# Patient Record
Sex: Female | Born: 1955 | Race: White | Hispanic: No | Smoking: Never smoker
Health system: Southern US, Community
[De-identification: ages and names within clinical notes are randomized; demographics above are authoritative.]

## PROBLEM LIST (undated history)

## (undated) DIAGNOSIS — F329 Major depressive disorder, single episode, unspecified: Secondary | ICD-10-CM

## (undated) DIAGNOSIS — C801 Malignant (primary) neoplasm, unspecified: Secondary | ICD-10-CM

## (undated) DIAGNOSIS — F32A Depression, unspecified: Secondary | ICD-10-CM

## (undated) DIAGNOSIS — B2790 Infectious mononucleosis, unspecified without complication: Secondary | ICD-10-CM

## (undated) DIAGNOSIS — E785 Hyperlipidemia, unspecified: Secondary | ICD-10-CM

## (undated) DIAGNOSIS — E079 Disorder of thyroid, unspecified: Secondary | ICD-10-CM

## (undated) DIAGNOSIS — I341 Nonrheumatic mitral (valve) prolapse: Secondary | ICD-10-CM

## (undated) HISTORY — DX: Hyperlipidemia, unspecified: E78.5

## (undated) HISTORY — PX: BREAST SURGERY: SHX581

## (undated) HISTORY — PX: MASTECTOMY: SHX3

## (undated) HISTORY — PX: ABDOMINAL HYSTERECTOMY: SHX81

## (undated) HISTORY — PX: OTHER SURGICAL HISTORY: SHX169

---

## 1988-04-13 DIAGNOSIS — I341 Nonrheumatic mitral (valve) prolapse: Secondary | ICD-10-CM

## 1988-04-13 HISTORY — DX: Nonrheumatic mitral (valve) prolapse: I34.1

## 1998-05-10 ENCOUNTER — Other Ambulatory Visit: Admission: RE | Admit: 1998-05-10 | Discharge: 1998-05-10 | Payer: Self-pay | Admitting: Gynecology

## 1999-07-17 ENCOUNTER — Encounter (INDEPENDENT_AMBULATORY_CARE_PROVIDER_SITE_OTHER): Payer: Self-pay

## 1999-07-17 ENCOUNTER — Other Ambulatory Visit: Admission: RE | Admit: 1999-07-17 | Discharge: 1999-07-17 | Payer: Self-pay | Admitting: Gynecology

## 2000-10-19 ENCOUNTER — Other Ambulatory Visit: Admission: RE | Admit: 2000-10-19 | Discharge: 2000-10-19 | Payer: Self-pay | Admitting: Gynecology

## 2000-10-19 ENCOUNTER — Encounter (INDEPENDENT_AMBULATORY_CARE_PROVIDER_SITE_OTHER): Payer: Self-pay | Admitting: Specialist

## 2001-11-23 ENCOUNTER — Other Ambulatory Visit: Admission: RE | Admit: 2001-11-23 | Discharge: 2001-11-23 | Payer: Self-pay | Admitting: Gynecology

## 2002-05-14 ENCOUNTER — Encounter: Payer: Self-pay | Admitting: Emergency Medicine

## 2002-05-14 ENCOUNTER — Emergency Department (HOSPITAL_COMMUNITY): Admission: EM | Admit: 2002-05-14 | Discharge: 2002-05-14 | Payer: Self-pay | Admitting: Emergency Medicine

## 2002-12-11 ENCOUNTER — Other Ambulatory Visit: Admission: RE | Admit: 2002-12-11 | Discharge: 2002-12-11 | Payer: Self-pay | Admitting: Gynecology

## 2003-05-18 ENCOUNTER — Encounter: Admission: RE | Admit: 2003-05-18 | Discharge: 2003-05-18 | Payer: Self-pay | Admitting: Psychiatry

## 2004-01-10 ENCOUNTER — Other Ambulatory Visit: Admission: RE | Admit: 2004-01-10 | Discharge: 2004-01-10 | Payer: Self-pay | Admitting: Gynecology

## 2004-03-28 ENCOUNTER — Ambulatory Visit (HOSPITAL_COMMUNITY): Admission: RE | Admit: 2004-03-28 | Discharge: 2004-03-28 | Payer: Self-pay | Admitting: Gynecology

## 2005-01-28 ENCOUNTER — Encounter (INDEPENDENT_AMBULATORY_CARE_PROVIDER_SITE_OTHER): Payer: Self-pay | Admitting: *Deleted

## 2005-01-28 ENCOUNTER — Observation Stay (HOSPITAL_COMMUNITY): Admission: RE | Admit: 2005-01-28 | Discharge: 2005-01-29 | Payer: Self-pay | Admitting: *Deleted

## 2006-02-02 ENCOUNTER — Encounter: Admission: RE | Admit: 2006-02-02 | Discharge: 2006-02-02 | Payer: Self-pay | Admitting: Orthopaedic Surgery

## 2006-02-23 ENCOUNTER — Other Ambulatory Visit: Admission: RE | Admit: 2006-02-23 | Discharge: 2006-02-23 | Payer: Self-pay | Admitting: Gynecology

## 2007-03-08 ENCOUNTER — Other Ambulatory Visit: Admission: RE | Admit: 2007-03-08 | Discharge: 2007-03-08 | Payer: Self-pay | Admitting: Gynecology

## 2008-05-10 ENCOUNTER — Other Ambulatory Visit: Admission: RE | Admit: 2008-05-10 | Discharge: 2008-05-10 | Payer: Self-pay | Admitting: Gynecology

## 2008-06-14 ENCOUNTER — Encounter: Admission: RE | Admit: 2008-06-14 | Discharge: 2008-06-14 | Payer: Self-pay | Admitting: General Surgery

## 2008-06-21 ENCOUNTER — Ambulatory Visit (HOSPITAL_COMMUNITY): Admission: RE | Admit: 2008-06-21 | Discharge: 2008-06-21 | Payer: Self-pay | Admitting: General Surgery

## 2008-06-21 ENCOUNTER — Encounter (INDEPENDENT_AMBULATORY_CARE_PROVIDER_SITE_OTHER): Payer: Self-pay | Admitting: General Surgery

## 2008-06-22 ENCOUNTER — Ambulatory Visit: Payer: Self-pay | Admitting: Oncology

## 2008-07-03 ENCOUNTER — Encounter (INDEPENDENT_AMBULATORY_CARE_PROVIDER_SITE_OTHER): Payer: Self-pay | Admitting: General Surgery

## 2008-07-03 ENCOUNTER — Ambulatory Visit (HOSPITAL_COMMUNITY): Admission: RE | Admit: 2008-07-03 | Discharge: 2008-07-03 | Payer: Self-pay | Admitting: General Surgery

## 2008-07-04 ENCOUNTER — Ambulatory Visit: Admission: RE | Admit: 2008-07-04 | Discharge: 2008-07-23 | Payer: Self-pay | Admitting: Radiation Oncology

## 2008-07-11 LAB — CBC WITH DIFFERENTIAL/PLATELET
BASO%: 0.7 % (ref 0.0–2.0)
EOS%: 2.4 % (ref 0.0–7.0)
MCH: 33.5 pg (ref 25.1–34.0)
MCHC: 35.2 g/dL (ref 31.5–36.0)
RDW: 12.7 % (ref 11.2–14.5)
lymph#: 1.8 10*3/uL (ref 0.9–3.3)

## 2008-07-11 LAB — COMPREHENSIVE METABOLIC PANEL
ALT: 21 U/L (ref 0–35)
AST: 24 U/L (ref 0–37)
Albumin: 4 g/dL (ref 3.5–5.2)
Calcium: 8.9 mg/dL (ref 8.4–10.5)
Chloride: 104 mEq/L (ref 96–112)
Potassium: 3.7 mEq/L (ref 3.5–5.3)

## 2008-07-25 ENCOUNTER — Ambulatory Visit: Payer: Self-pay | Admitting: Cardiology

## 2008-07-25 ENCOUNTER — Ambulatory Visit: Admission: RE | Admit: 2008-07-25 | Discharge: 2008-07-25 | Payer: Self-pay | Admitting: Oncology

## 2008-07-25 ENCOUNTER — Encounter: Payer: Self-pay | Admitting: Oncology

## 2008-08-02 ENCOUNTER — Ambulatory Visit: Payer: Self-pay | Admitting: Oncology

## 2008-08-02 LAB — CBC WITH DIFFERENTIAL/PLATELET
BASO%: 0.1 % (ref 0.0–2.0)
Eosinophils Absolute: 0 10*3/uL (ref 0.0–0.5)
MCHC: 34.8 g/dL (ref 31.5–36.0)
MONO#: 0.9 10*3/uL (ref 0.1–0.9)
NEUT#: 8.9 10*3/uL — ABNORMAL HIGH (ref 1.5–6.5)
Platelets: 266 10*3/uL (ref 145–400)
RBC: 4.08 10*6/uL (ref 3.70–5.45)
RDW: 12.6 % (ref 11.2–14.5)
WBC: 10.8 10*3/uL — ABNORMAL HIGH (ref 3.9–10.3)
lymph#: 1 10*3/uL (ref 0.9–3.3)

## 2008-08-09 LAB — COMPREHENSIVE METABOLIC PANEL
ALT: 74 U/L — ABNORMAL HIGH (ref 0–35)
CO2: 30 mEq/L (ref 19–32)
Calcium: 9.3 mg/dL (ref 8.4–10.5)
Chloride: 104 mEq/L (ref 96–112)
Creatinine, Ser: 0.68 mg/dL (ref 0.40–1.20)
Glucose, Bld: 104 mg/dL — ABNORMAL HIGH (ref 70–99)
Sodium: 140 mEq/L (ref 135–145)
Total Bilirubin: 0.9 mg/dL (ref 0.3–1.2)
Total Protein: 6.4 g/dL (ref 6.0–8.3)

## 2008-08-09 LAB — CBC WITH DIFFERENTIAL/PLATELET
BASO%: 0.4 % (ref 0.0–2.0)
Eosinophils Absolute: 0.1 10*3/uL (ref 0.0–0.5)
HCT: 39 % (ref 34.8–46.6)
HGB: 13.4 g/dL (ref 11.6–15.9)
LYMPH%: 12.6 % — ABNORMAL LOW (ref 14.0–49.7)
MCHC: 34.5 g/dL (ref 31.5–36.0)
MONO#: 2.3 10*3/uL — ABNORMAL HIGH (ref 0.1–0.9)
NEUT#: 15.5 10*3/uL — ABNORMAL HIGH (ref 1.5–6.5)
NEUT%: 75.3 % (ref 38.4–76.8)
Platelets: 278 10*3/uL (ref 145–400)
WBC: 20.6 10*3/uL — ABNORMAL HIGH (ref 3.9–10.3)
lymph#: 2.6 10*3/uL (ref 0.9–3.3)

## 2008-08-23 LAB — CBC WITH DIFFERENTIAL/PLATELET
BASO%: 0.1 % (ref 0.0–2.0)
Eosinophils Absolute: 0 10*3/uL (ref 0.0–0.5)
MONO#: 0.7 10*3/uL (ref 0.1–0.9)
MONO%: 7 % (ref 0.0–14.0)
NEUT#: 7.8 10*3/uL — ABNORMAL HIGH (ref 1.5–6.5)
RBC: 3.83 10*6/uL (ref 3.70–5.45)
RDW: 13.4 % (ref 11.2–14.5)
WBC: 9.4 10*3/uL (ref 3.9–10.3)

## 2008-08-30 LAB — COMPREHENSIVE METABOLIC PANEL
ALT: 23 U/L (ref 0–35)
Albumin: 3.9 g/dL (ref 3.5–5.2)
CO2: 31 mEq/L (ref 19–32)
Calcium: 9.1 mg/dL (ref 8.4–10.5)
Chloride: 100 mEq/L (ref 96–112)
Glucose, Bld: 100 mg/dL — ABNORMAL HIGH (ref 70–99)
Sodium: 137 mEq/L (ref 135–145)
Total Bilirubin: 1.1 mg/dL (ref 0.3–1.2)
Total Protein: 6.5 g/dL (ref 6.0–8.3)

## 2008-08-30 LAB — CBC WITH DIFFERENTIAL/PLATELET
Eosinophils Absolute: 0.1 10*3/uL (ref 0.0–0.5)
HCT: 35.7 % (ref 34.8–46.6)
LYMPH%: 18.8 % (ref 14.0–49.7)
MONO#: 1.5 10*3/uL — ABNORMAL HIGH (ref 0.1–0.9)
NEUT#: 7.1 10*3/uL — ABNORMAL HIGH (ref 1.5–6.5)
Platelets: 286 10*3/uL (ref 145–400)
RBC: 3.76 10*6/uL (ref 3.70–5.45)
WBC: 10.8 10*3/uL — ABNORMAL HIGH (ref 3.9–10.3)
lymph#: 2 10*3/uL (ref 0.9–3.3)

## 2008-09-11 ENCOUNTER — Ambulatory Visit: Payer: Self-pay | Admitting: Oncology

## 2008-09-13 LAB — CBC WITH DIFFERENTIAL/PLATELET
BASO%: 0.1 % (ref 0.0–2.0)
LYMPH%: 6.9 % — ABNORMAL LOW (ref 14.0–49.7)
MCHC: 33.9 g/dL (ref 31.5–36.0)
MCV: 94.6 fL (ref 79.5–101.0)
MONO%: 2.8 % (ref 0.0–14.0)
Platelets: 301 10*3/uL (ref 145–400)
RBC: 3.55 10*6/uL — ABNORMAL LOW (ref 3.70–5.45)
nRBC: 0 % (ref 0–0)

## 2008-09-20 LAB — CBC WITH DIFFERENTIAL/PLATELET
BASO%: 2.1 % — ABNORMAL HIGH (ref 0.0–2.0)
EOS%: 1.1 % (ref 0.0–7.0)
HCT: 34.8 % (ref 34.8–46.6)
MCH: 32.1 pg (ref 25.1–34.0)
MCHC: 33.9 g/dL (ref 31.5–36.0)
NEUT%: 59.7 % (ref 38.4–76.8)
RDW: 13.8 % (ref 11.2–14.5)
lymph#: 2.2 10*3/uL (ref 0.9–3.3)

## 2008-10-04 LAB — CBC WITH DIFFERENTIAL/PLATELET
BASO%: 0.1 % (ref 0.0–2.0)
Basophils Absolute: 0 10*3/uL (ref 0.0–0.1)
EOS%: 0 % (ref 0.0–7.0)
HGB: 11.3 g/dL — ABNORMAL LOW (ref 11.6–15.9)
MCH: 31.9 pg (ref 25.1–34.0)
RDW: 13.9 % (ref 11.2–14.5)
lymph#: 0.7 10*3/uL — ABNORMAL LOW (ref 0.9–3.3)
nRBC: 0 % (ref 0–0)

## 2008-10-16 ENCOUNTER — Ambulatory Visit: Payer: Self-pay | Admitting: Oncology

## 2008-10-18 LAB — COMPREHENSIVE METABOLIC PANEL
ALT: 35 U/L (ref 0–35)
Alkaline Phosphatase: 101 U/L (ref 39–117)
CO2: 29 mEq/L (ref 19–32)
Sodium: 140 mEq/L (ref 135–145)
Total Bilirubin: 1.3 mg/dL — ABNORMAL HIGH (ref 0.3–1.2)
Total Protein: 6.9 g/dL (ref 6.0–8.3)

## 2008-10-18 LAB — CBC WITH DIFFERENTIAL/PLATELET
BASO%: 0.4 % (ref 0.0–2.0)
LYMPH%: 15.7 % (ref 14.0–49.7)
MCHC: 34.7 g/dL (ref 31.5–36.0)
MONO#: 0.7 10*3/uL (ref 0.1–0.9)
MONO%: 8.9 % (ref 0.0–14.0)
Platelets: 272 10*3/uL (ref 145–400)
RBC: 3.75 10*6/uL (ref 3.70–5.45)
RDW: 15 % — ABNORMAL HIGH (ref 11.2–14.5)
WBC: 7.6 10*3/uL (ref 3.9–10.3)

## 2009-01-22 ENCOUNTER — Ambulatory Visit: Payer: Self-pay | Admitting: Oncology

## 2009-01-24 LAB — CBC WITH DIFFERENTIAL/PLATELET
Basophils Absolute: 0 10*3/uL (ref 0.0–0.1)
Eosinophils Absolute: 0.1 10*3/uL (ref 0.0–0.5)
HGB: 13.7 g/dL (ref 11.6–15.9)
MONO#: 0.4 10*3/uL (ref 0.1–0.9)
MONO%: 8.1 % (ref 0.0–14.0)
NEUT#: 2.7 10*3/uL (ref 1.5–6.5)
RBC: 4.22 10*6/uL (ref 3.70–5.45)
RDW: 12.7 % (ref 11.2–14.5)
WBC: 4.4 10*3/uL (ref 3.9–10.3)
lymph#: 1.2 10*3/uL (ref 0.9–3.3)

## 2009-01-24 LAB — COMPREHENSIVE METABOLIC PANEL
Albumin: 4.4 g/dL (ref 3.5–5.2)
Alkaline Phosphatase: 85 U/L (ref 39–117)
CO2: 27 mEq/L (ref 19–32)
Calcium: 9.6 mg/dL (ref 8.4–10.5)
Chloride: 108 mEq/L (ref 96–112)
Glucose, Bld: 125 mg/dL — ABNORMAL HIGH (ref 70–99)
Potassium: 4 mEq/L (ref 3.5–5.3)
Sodium: 143 mEq/L (ref 135–145)
Total Protein: 6.7 g/dL (ref 6.0–8.3)

## 2009-12-19 ENCOUNTER — Ambulatory Visit (HOSPITAL_BASED_OUTPATIENT_CLINIC_OR_DEPARTMENT_OTHER): Payer: BC Managed Care – PPO | Admitting: Oncology

## 2009-12-24 LAB — COMPREHENSIVE METABOLIC PANEL
ALT: 21 U/L (ref 0–35)
AST: 23 U/L (ref 0–37)
BUN: 21 mg/dL (ref 6–23)
CO2: 26 mEq/L (ref 19–32)
Creatinine, Ser: 0.78 mg/dL (ref 0.40–1.20)
Total Bilirubin: 1.1 mg/dL (ref 0.3–1.2)

## 2009-12-24 LAB — CBC WITH DIFFERENTIAL/PLATELET
BASO%: 0.5 % (ref 0.0–2.0)
Basophils Absolute: 0 10*3/uL (ref 0.0–0.1)
EOS%: 0.7 % (ref 0.0–7.0)
HCT: 37.5 % (ref 34.8–46.6)
LYMPH%: 34.4 % (ref 14.0–49.7)
MCH: 33.3 pg (ref 25.1–34.0)
MCHC: 34.9 g/dL (ref 31.5–36.0)
NEUT%: 52.8 % (ref 38.4–76.8)
Platelets: 313 10*3/uL (ref 145–400)

## 2009-12-24 LAB — CANCER ANTIGEN 27.29: CA 27.29: 31 U/mL (ref 0–39)

## 2010-01-15 ENCOUNTER — Encounter: Admission: RE | Admit: 2010-01-15 | Discharge: 2010-01-15 | Payer: Self-pay | Admitting: Family Medicine

## 2010-05-04 ENCOUNTER — Encounter: Payer: Self-pay | Admitting: Family Medicine

## 2010-06-23 ENCOUNTER — Encounter: Payer: BC Managed Care – PPO | Admitting: Oncology

## 2010-06-23 DIAGNOSIS — C50219 Malignant neoplasm of upper-inner quadrant of unspecified female breast: Secondary | ICD-10-CM

## 2010-06-23 LAB — CBC WITH DIFFERENTIAL/PLATELET
Basophils Absolute: 0 10*3/uL (ref 0.0–0.1)
EOS%: 2.6 % (ref 0.0–7.0)
Eosinophils Absolute: 0.1 10*3/uL (ref 0.0–0.5)
HCT: 38 % (ref 34.8–46.6)
HGB: 13.2 g/dL (ref 11.6–15.9)
MCH: 33.1 pg (ref 25.1–34.0)
MONO#: 0.4 10*3/uL (ref 0.1–0.9)
NEUT#: 2.1 10*3/uL (ref 1.5–6.5)
NEUT%: 50.7 % (ref 38.4–76.8)
lymph#: 1.5 10*3/uL (ref 0.9–3.3)

## 2010-06-23 LAB — COMPREHENSIVE METABOLIC PANEL
Albumin: 4 g/dL (ref 3.5–5.2)
BUN: 15 mg/dL (ref 6–23)
CO2: 26 mEq/L (ref 19–32)
Calcium: 9.2 mg/dL (ref 8.4–10.5)
Chloride: 103 mEq/L (ref 96–112)
Creatinine, Ser: 0.61 mg/dL (ref 0.40–1.20)
Glucose, Bld: 123 mg/dL — ABNORMAL HIGH (ref 70–99)
Potassium: 4 mEq/L (ref 3.5–5.3)

## 2010-06-23 LAB — CANCER ANTIGEN 27.29: CA 27.29: 32 U/mL (ref 0–39)

## 2010-06-26 ENCOUNTER — Encounter (HOSPITAL_BASED_OUTPATIENT_CLINIC_OR_DEPARTMENT_OTHER): Payer: BC Managed Care – PPO | Admitting: Oncology

## 2010-06-26 DIAGNOSIS — C50219 Malignant neoplasm of upper-inner quadrant of unspecified female breast: Secondary | ICD-10-CM

## 2010-07-24 LAB — BASIC METABOLIC PANEL
CO2: 30 mEq/L (ref 19–32)
Calcium: 9.7 mg/dL (ref 8.4–10.5)
Creatinine, Ser: 0.59 mg/dL (ref 0.4–1.2)
GFR calc Af Amer: >60 mL/min (ref >60)
GFR calc non Af Amer: >60 mL/min (ref >60)
Glucose, Bld: 92 mg/dL (ref 70–99)
Sodium: 140 mEq/L (ref 135–145)

## 2010-07-24 LAB — DIFFERENTIAL
Basophils Relative: 2 % — ABNORMAL HIGH (ref 0–1)
Eosinophils Absolute: 0.2 10*3/uL (ref 0.0–0.7)
Eosinophils Relative: 4 % (ref 0–5)
Lymphs Abs: 1.8 10*3/uL (ref 0.7–4.0)
Monocytes Relative: 12 % (ref 3–12)
Neutrophils Relative %: 46 % (ref 43–77)

## 2010-07-24 LAB — COMPREHENSIVE METABOLIC PANEL
ALT: 16 U/L (ref 0–35)
AST: 21 U/L (ref 0–37)
Alkaline Phosphatase: 66 U/L (ref 39–117)
CO2: 30 mEq/L (ref 19–32)
Calcium: 9.2 mg/dL (ref 8.4–10.5)
GFR calc Af Amer: >60 mL/min (ref >60)
GFR calc non Af Amer: >60 mL/min (ref >60)
Glucose, Bld: 106 mg/dL — ABNORMAL HIGH (ref 70–99)
Potassium: 5.3 mEq/L — ABNORMAL HIGH (ref 3.5–5.1)
Sodium: 143 mEq/L (ref 135–145)

## 2010-07-24 LAB — CBC
Hemoglobin: 12.2 g/dL (ref 12.0–15.0)
MCHC: 35.1 g/dL (ref 30.0–36.0)
Platelets: 322 10*3/uL (ref 150–400)
RBC: 3.7 MIL/uL — ABNORMAL LOW (ref 3.87–5.11)
RDW: 12.9 % (ref 11.5–15.5)
WBC: 5 10*3/uL (ref 4.0–10.5)

## 2010-08-26 NOTE — Op Note (Signed)
Brandi Cummings, Brandi Cummings             ACCOUNT NO.:  [id number]   MEDICAL RECORD NO.:  [id number]          PATIENT TYPE:  AMB   LOCATION:  SDS                          FACILITY:  [facility]   PHYSICIAN:  Sharlet Salina T. Hoxworth, M.D.DATE OF BIRTH:  1956-01-01   DATE OF PROCEDURE:  06/21/2008  DATE OF DISCHARGE:                               OPERATIVE REPORT   PREOPERATIVE DIAGNOSIS:  Cancer, left breast.   POSTOPERATIVE DIAGNOSIS:  Cancer, left breast.   SURGICAL PROCEDURES:  1. Blue dye injection, left breast.  2. Left axillary sentinel lymph node biopsy.  3. Left breast lumpectomy (re-excision).   HISTORY:  Brandi Cummings is a 55 year old female who recently underwent  excision of a palpable abnormality in her upper left breast that was  felt to be radiographically benign.  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  This was not oriented due to preoperative  benign characteristics.  After discussion of options, we elected to  proceed with left axillary sentinel lymph node biopsy and re-excision of  the lumpectomy site.  She was brought to operating room for this  procedure.  Risks of bleeding, infection, and possible need for further  surgery based on final pathology findings have been discussed and  understood.   DESCRIPTION OF OPERATION:  The patient was brought to operating room,  placed in the supine position on the operating table, and general  laryngeal mask anesthesia was induced.  She was carefully positioned  with the left arm extended on an arm board.  Under sterile technique, 10  mL of dilute methylene blue was injected in the subcutaneous tissue  beneath the nipple and massaged.  She had undergone injection of 1 mCi  of technetium sulfa colloid intradermally around the nipple about 45  minutes prior to the procedure.  The entire left chest, arm were widely  sterilely prepped and draped.   She received preoperative IV antibiotics.  Correct patient and procedure were verified.  The NeoProbe was used to  localize a definite hot area in the left axilla.  A small transverse  incision was made and dissection was carried down through the  subcutaneous tissue using cautery.  Using careful blunt dissection the  axilla proper was entered and blue lymphatic was found.  Using the  NeoProbe and tracing the blue lymphatic, I dissected down to a bright  blue otherwise normal-appearing lymph node with very high counts.  This  was completely excised with cautery.  Ex vivo the node had counts of  3200 with background in the axilla of less than 20.  This was sent as a  hot blue sentinel lymph node for touch prep.  I then proceeded with the  re-excision.  The previous curvilinear scar in the upper outer quadrant  was excised and the previous lumpectomy was close to the skin, so skin  and subcu was taken with the specimen as the anterior margin.  The  seroma was evacuated.  There was very distinct lumpectomy cavity.  I  then sequentially excised the entire wall  of the lumpectomy cavity back  for about 0.5-1 cm in all directions, completely excising the cavity.  This was done for superior-inferior, medial-lateral, and deep margins  which were separately marked and oriented.  The soft tissue in both  incisions was infiltrated with Marcaine.  The touch prep on the sentinel  lymph node returned as negative.  In order to close the defect in the  breast tissue, breast tissue was mobilized centrally, inferiorly, and  laterally mobilizing some skin and subcu over the breast tissue and  mobilizing the breast tissue off the chest wall.  This was able to be  rotated and then to fill the defect and was closed with interrupted 3-0  Vicryl.  The subcu was closed with interrupted 3-0 Vicryl and skin  closed with subcuticular  Monocryl.  The sentinel node site was closed with subcutaneous 3-0  Vicryl and  subcuticular Monocryl.  Both incisions were dressed with  Dermabond.  Sponge, needle, and instrument counts were correct.  The  patient was taken to recovery in good condition.      Lorne Skeens. Hoxworth, M.D.  Electronically Signed     BTH/MEDQ  D:  06/21/2008  T:  06/22/2008  Job:  [id number]

## 2010-08-26 NOTE — Op Note (Signed)
NAMECOURTNEE, MYER             ACCOUNT NO.:  [id number]   MEDICAL RECORD NO.:  [id number]          PATIENT TYPE:  AMB   LOCATION:  DAY                          FACILITY:  [facility]   PHYSICIAN:  Sharlet Salina T. Hoxworth, M.D.DATE OF BIRTH:  01-14-1956   DATE OF PROCEDURE:  07/03/2008  DATE OF DISCHARGE:                               OPERATIVE REPORT   POSTOPERATIVE DIAGNOSIS:  Cancer left breast.   SURGICAL PROCEDURES:  Re-excision cancer left breast.   ANESTHESIA:  Laryngeal mask general.   BRIEF HISTORY:  This is a 55 year old female who has previously  undergone lumpectomy approximately 10 days ago for invasive in situ  carcinoma of the left breast.  The lumpectomy specimen had a focally  positive margin for DCIS at the deep margin.  I have recommended a re-  excision of this margin.  Risks of bleeding, infection were discussed  and understood.  She is brought to the operating room for this  procedure.   DESCRIPTION OF OPERATION:  The patient brought to operating room, placed  in the supine position on the operating table and laryngeal mask general  anesthesia was induced.  Left breast was widely sterilely prepped and  draped.  She received preoperative IV antibiotics.  Correct patient and  procedure were verified.  The previous curvilinear upper breast incision  was excised back to healthy skin and dissection carried down through  subcutaneous tissue into the lumpectomy cavity.  A small seroma and  clots were evacuated.  The breast tissue had been closed and the Vicryl  sutures were removed, completely opening the previous lumpectomy cavity  up.  Following this the entire deep margin which consisted of a small  amount of breast tissue and the pectoralis fascia was completely excised  down to muscle.  This was the complete margin and I included a little  bit of the sidewalls as well.  Hemostasis obtained with cautery.  The  soft tissues were all infiltrated with Marcaine.   Hemostasis was  assured.  The breast tissue which had been mobilized to close previously  was reclosed with interrupted Vicryl's, subcu closed with interrupted 3-  0 Vicryl, skin with subcuticular Monocryl and Dermabond.  Sponge and  needle counts correct.      Lorne Skeens. Hoxworth, M.D.  Electronically Signed     BTH/MEDQ  D:  07/03/2008  T:  07/03/2008  Job:  [id number]

## 2010-08-29 NOTE — Discharge Summary (Signed)
Brandi Cummings, Brandi Cummings             ACCOUNT NO.:  [id number]   MEDICAL RECORD NO.:  [id number]          PATIENT TYPE:  OBV   LOCATION:  9304                          [facility]ACILITY:  [facility]   PHYSICIAN:  Almedia Balls. Fore, M.D.   DATE OF BIRTH:  [birth year]   DATE OF ADMISSION:  01/28/2005  DATE OF DISCHARGE:  01/29/2005                                 DISCHARGE SUMMARY   HISTORY:  The patient is a 55 year old with abnormal uterine bleeding,  pelvic pain, uterine enlargement for hysterectomy and bilateral salpingo-  oophorectomy on January 28, 2005.  The remainder of her history and physical  are as previously dictated.  Laboratory data included preoperative  hemoglobin 12.9.   HOSPITAL COURSE:  The patient was taken to the operating room on January 28, 2005 at which time abdominal supracervical hysterectomy and bilateral  salpingo-oophorectomy were performed.  The patient did well postoperatively  except for some nausea and vomiting which resolved on the first  postoperative day.  Diet and ambulation progressed over the evening of  October 18 and early morning of October 19.  Later in the day on October 19,  she was afebrile, experiencing no problems and was well maintained on oral  medications.  It was felt that she could be discharged at this time.   FINAL DIAGNOSES:  1.  Abnormal uterine bleeding.  2.  Pelvic pain.  3.  Uterine enlargement.   OPERATION/PROCEDURE:  1.  Abdominal supracervical hysterectomy.  2.  Bilateral salpingo-oophorectomy.   Pathology report unavailable at the time of this discharge.   DISPOSITION:  Discharge home to return to the office in approximately two  weeks.  She was instructed to call if she has unusual bleeding, pain or  unexplained fever.  She was also instructed to gradually progress her  activities over several weeks at home and to limit lifting and driving for  two weeks.  She was fully ambulatory, on a regular diet and in good  condition at the time  of discharge.   DISCHARGE MEDICATIONS:  She was given prescription for:  1.  Mepergan Fortis generic #30 to be taken one q.4-6h. p.r.n. pain.  2.  Doxycycline 100 mg #12 to be taken one twice a day.  3.  Vivelle-Dot patches 0.05 mg change twice a week.           ______________________________  Almedia Balls. Randell Patient, M.D.     SRF/MEDQ  D:  01/29/2005  T:  01/30/2005  Job:  [id number]   cc:   Leatha Gilding. Mezer, M.D.  Fax: [phone]

## 2010-08-29 NOTE — H&P (Signed)
NAMEAMORA, SHEEHY             ACCOUNT NO.:  [id number]   MEDICAL RECORD NO.:  [id number]          PATIENT TYPE:  INP   LOCATION:  NA                            FACILITY[facility]   PHYSICIAN:  Almedia Balls. Fore, M.D.   DATE OF BIRTH:  01-Nov-1955   DATE OF ADMISSION:  DATE OF DISCHARGE:                                HISTORY & PHYSICAL   DATE OF ADMISSION:  January 28, 2005   HISTORY:  The patient is a 55 year old with persistent abnormal uterine  bleeding, uterine enlargement, for hysterectomy, bilateral salpingo-  oophorectomy on January 28, 2005. She has been evaluated over many years by  Dr. Chevis Pretty for this problem and has gotten to the point where her periods are  extremely heavy, very painful. She underwent endometrial biopsy in July 2006  with findings of benign endometrium. Pap smear done approximately 1 year was  within normal limits. She is admitted at this time for hysterectomy and has  been counseled as to the nature of the procedure and the risks involving, to  include risks of anesthesia; injury to bowel, bladder, blood vessels,  ureters; postoperative hemorrhage; infection; recuperation; hormone  replacement following surgery. She fully understands all these  considerations and wishes to proceed on January 28, 2005.   PAST MEDICAL HISTORY:  Includes laparoscopy in 1986, laparoscopy D&C in  1996. She is taking Zyrtec for allergies. She is sensitive to PENICILLIN.   FAMILY HISTORY:  Includes father with prostate cancer. Grandmother with  cervical cancer. Mother with hypertension. Father also had myocardial  infarction and stroke. Mother and uncles have diabetes mellitus.   REVIEW OF SYSTEMS:  HEENT:  Some hearing loss in her left ear.  CARDIORESPIRATORY:  Mitral valve prolapse. GASTROINTESTINAL:  History of  some hepatitis when she had mononucleosis as a teenager. GENITOURINARY:  As  in present illness. NEUROMUSCULAR:  Negative.   PHYSICAL EXAMINATION:  VITAL SIGNS:   Height 5 feet 9.5 inches tall, weight  166 pounds, blood pressure 116/68, pulse 84, respirations 18.  GENERAL:  Well-developed white female in no acute distress.  HEENT:  Within normal limits.  NECK:  Supple without masses, adenopathy, or bruits.  HEART:  Regular rate and rhythm without murmurs. Possible presystolic click  along the left sternal border.  BREASTS:  Without mass. Axilla negative.  ABDOMEN:  Flat and soft without masses.  PELVIC:  External genitalia, Bartholin's, urethra, and Skene's glands within  normal limits. Cervix is slightly inflamed. The uterus approximately 8-[redacted]  weeks gestational size, slightly irregular, tender. Adnexal areas without  palpable masses but somewhat tender bilaterally. Anterior and posterior cul-  de-sac exam is confirmatory.  EXTREMITIES:  Within normal limits.  CENTRAL NERVOUS SYSTEM:  Grossly intact.  SKIN:  Without suspicious lesions.   IMPRESSION:  Abnormal uterine bleeding, probable fibroids.   DISPOSITION:  As noted above.           ______________________________  Almedia Balls. Randell Patient, M.D.     SRF/MEDQ  D:  01/21/2005  T:  01/21/2005  Job:  [id number]

## 2010-09-16 ENCOUNTER — Other Ambulatory Visit: Payer: Self-pay | Admitting: Dermatology

## 2011-03-20 ENCOUNTER — Telehealth: Payer: Self-pay | Admitting: *Deleted

## 2011-03-20 NOTE — Telephone Encounter (Signed)
patient confirmed over the phone the new date and time on 06-25-2011 at 11:30am dr. Darnelle Catalan 06-18-2011 at 10:00am for lab only

## 2011-06-18 ENCOUNTER — Other Ambulatory Visit: Payer: BC Managed Care – PPO | Admitting: Lab

## 2011-06-25 ENCOUNTER — Ambulatory Visit: Payer: BC Managed Care – PPO | Admitting: Oncology

## 2011-08-18 ENCOUNTER — Other Ambulatory Visit (HOSPITAL_BASED_OUTPATIENT_CLINIC_OR_DEPARTMENT_OTHER): Payer: BC Managed Care – PPO | Admitting: Lab

## 2011-08-18 DIAGNOSIS — C50219 Malignant neoplasm of upper-inner quadrant of unspecified female breast: Secondary | ICD-10-CM

## 2011-08-18 LAB — CBC WITH DIFFERENTIAL/PLATELET
BASO%: 2.1 % — ABNORMAL HIGH (ref 0.0–2.0)
Basophils Absolute: 0.1 10*3/uL (ref 0.0–0.1)
HCT: 38.3 % (ref 34.8–46.6)
LYMPH%: 37.6 % (ref 14.0–49.7)
MCHC: 34.4 g/dL (ref 31.5–36.0)
MONO#: 0.5 10*3/uL (ref 0.1–0.9)
NEUT%: 45.6 % (ref 38.4–76.8)
Platelets: 314 10*3/uL (ref 145–400)
WBC: 4.5 10*3/uL (ref 3.9–10.3)

## 2011-08-18 LAB — COMPREHENSIVE METABOLIC PANEL
Alkaline Phosphatase: 89 U/L (ref 39–117)
CO2: 25 mEq/L (ref 19–32)
Creatinine, Ser: 0.72 mg/dL (ref 0.50–1.10)
Glucose, Bld: 115 mg/dL — ABNORMAL HIGH (ref 70–99)
Sodium: 142 mEq/L (ref 135–145)
Total Bilirubin: 1 mg/dL (ref 0.3–1.2)
Total Protein: 6.4 g/dL (ref 6.0–8.3)

## 2011-08-25 ENCOUNTER — Telehealth: Payer: Self-pay | Admitting: *Deleted

## 2011-08-25 ENCOUNTER — Ambulatory Visit (HOSPITAL_BASED_OUTPATIENT_CLINIC_OR_DEPARTMENT_OTHER): Payer: BC Managed Care – PPO | Admitting: Oncology

## 2011-08-25 VITALS — BP 132/86 | HR 99 | Temp 98.5°F | Ht 67.0 in | Wt 197.4 lb

## 2011-08-25 DIAGNOSIS — C50219 Malignant neoplasm of upper-inner quadrant of unspecified female breast: Secondary | ICD-10-CM

## 2011-08-25 DIAGNOSIS — C50919 Malignant neoplasm of unspecified site of unspecified female breast: Secondary | ICD-10-CM | POA: Insufficient documentation

## 2011-08-25 NOTE — Telephone Encounter (Signed)
gave patient appointment for 08-2012 with the lab being one week before the md appointment printed out calendar and gave to the patient

## 2011-08-25 NOTE — Progress Notes (Signed)
ID: Brandi Cummings   DOB: Aug 27, 1955  MR#: [id number]  CSN#:[id number]  HISTORY OF PRESENT ILLNESS: She noted a lump in her left breast in the fall of 2009 and had mammography at [facility] with Cassandria Anger on February 17, 2008.  He found the breast to be heterogeneously dense with no interval change by mammography.  Ultrasound showed an oval, well circumscribed hypoechoic mass measuring 1 cm in the 10'clock position, 2 cm from the nipple in the left breast corresponding to the palpable mass.  This was felt to be benign appearing fibrocystic change and a six-month mammogram was suggested.  In January of 2010 she saw Dr. Chevis Pretty who palpated the mass and he referred her to Dr. Jaclynn Guarneri for further evaluation.  Dr. Johna Sheriff reviewed the data and offered the patient further observation as already discussed versus excision and the patient chose the latter.  Her excisional biopsy was performed May 28, 2008 and showed an invasive ductal carcinoma, grade 3, measuring 7 mm, with good margins for the invasive component, but what looked like positive margins in the in situ component.  No lymphovascular invasion and a prognostic panel which showed the tumor to be ER and PR negative with a proliferation marker of 34% and HER2/neu negative by CISH with a ratio of 1.21 (PM10-121 and OS10-2305).  With this information, the patient proceeded to bilateral breast MRIs on March 4 with this showed only a seroma in the upper inner quadrant of the left breast measuring 4.9 cm, but otherwise no significant findings on either side and no evidence of internal mammary or axillary lymph node enhancement.  On June 21, 2008 the patient underwent reexcision together with left axillary lymph node biopsy ([id number]) a negative sentinel lymph node with the inferior margin still showing a microscopic focus of high-grade ductal carcinoma with the deep margins still showing focal involvement by ductal carcinoma in situ.  All the other margins  were negative.   Her subsequent history is as detailed below.  INTERVAL HISTORY: Brandi Cummings returns today for routine followup of her breast cancer. Since her last visit here she tells me Dr. Talmage Nap has found her to be diabetic. She has not started a diet or exercise program yet and we discussed extensively today.  REVIEW OF SYSTEMS: She came off her antidepressant about a month ago. She felt that they were contributing to weight gain. Initially she "ache all over". She still feels physically uncomfortable off those medications. She is not however depressed or anxious. She has occasional mild headaches. She is not aware of polyuria or dysuria. She runs a little bit on the constipated side and we talked about the use of MiraLAX. She still having hot flashes. She does not think these are of consequent she'll. Otherwise a detailed review of systems today was stable  PAST MEDICAL HISTORY: Significant for hysterectomy with bilateral salpingo-oophorectomy in 2006 "I kept my cervix."  She has a history of mitral valve prolapse not requiring antibiotics before dental procedures.  She has a history of hypothyroidism, history of migraines, history of mild osteoarthritis particularly involving the hands.  She has a history of in vitro fertilization remotely. New diagnosis of diabetes  FAMILY HISTORY The patient's father died at the age of 56 in the setting of prostate cancer.  The patient's mother is alive in her 56s.  The patient is one of four siblings.  There is no history of breast or ovarian cancer in the family.  GYNECOLOGIC HISTORY: She is Gx, P2 first  pregnancy at age 56.  She was still menstruating in 2006.  She did not take hormones after the hysterectomy and bilateral salpingo-oophorectomy.    SOCIAL HISTORY: She is a Futures trader.  Her husband Freida Busman works in Consulting civil engineer mostly in [place] and does a lot of traveling.  Both her sons are currently in college.  The patient attends a [employer].   ADVANCED DIRECTIVES: in place  HEALTH MAINTENANCE: History  Substance Use Topics  . Smoking status: Not on file  . Smokeless tobacco: Not on file  . Alcohol Use: Not on file     Allergies  Allergen Reactions  . Penicillins     Current Outpatient Prescriptions  Medication Sig Dispense Refill  . ARMOUR THYROID 15 MG tablet Take 45 mg by mouth daily.       . metFORMIN (GLUCOPHAGE-XR) 500 MG 24 hr tablet       . LORazepam (ATIVAN) 0.5 MG tablet         OBJECTIVE: 56-year-old white woman who appears well Filed Vitals:   08/25/11 0954  BP: 132/86  Pulse: 99  Temp: 98.5 F (36.9 C)     Body mass index is 30.92 kg/(m^2).    ECOG FS: 1  Sclerae unicteric Oropharynx clear No peripheral adenopathy Lungs no rales or rhonchi Heart regular rate and rhythm Abd benign MSK no focal spinal tenderness, no peripheral edema Neuro: nonfocal Breasts: She is status post bilateral mastectomies with bilateral reconstruction. There is no evidence of local recurrence  LAB RESULTS: Lab Results  Component Value Date   WBC 4.5 08/18/2011   NEUTROABS 2.0 08/18/2011   HGB 13.2 08/18/2011   HCT 38.3 08/18/2011   MCV 95.1 08/18/2011   PLT 314 08/18/2011      Chemistry      Component Value Date/Time   NA 142 08/18/2011 0909   K 3.9 08/18/2011 0909   CL 106 08/18/2011 0909   CO2 25 08/18/2011 0909   BUN 14 08/18/2011 0909   CREATININE 0.72 08/18/2011 0909      Component Value Date/Time   CALCIUM 9.6 08/18/2011 0909   ALKPHOS 89 08/18/2011 0909   AST 24 08/18/2011 0909   ALT 19 08/18/2011 0909   BILITOT 1.0 08/18/2011 0909       Lab Results  Component Value Date   LABCA2 32 08/18/2011    No components found with this basename: LABCA125    No results found for this basename: INR:1;PROTIME:1 in the last 168 hours  Urinalysis No results found for this basename: colorurine, appearanceur, labspec, phurine, glucoseu, hgbur, bilirubinur, ketonesur, proteinur, urobilinogen, nitrite, leukocytesur     STUDIES: No new results found.  ASSESSMENT: 56-year-old [place] woman status post left breast biopsy March 2010 for a triple negative invasive ductal carcinoma, T1bN0 (stage IA) , grade 3, treated adjuvantly with Cytoxan and Taxotere x4, completed in June 2010, followed by bilateral mastectomies and bilateral gluteal artery perforator flap reconstruction.   PLAN: Brandi Cummings is doing great from a breast cancer point of view. She is going to see [place] again in one year and then 2 years from now she will "graduate" from followup. I have suggested she work towards exercising 45 minutes 5 times a week. She already has a meeting with Dr. Willeen Cass dietitian, but the basic equation is simple, calories in and minus calories out equals weight change. We will followup on these ancillary issues with her visit next year   Abbiegail Landgren C    08/25/2011

## 2011-09-08 ENCOUNTER — Emergency Department (HOSPITAL_COMMUNITY)
Admission: EM | Admit: 2011-09-08 | Discharge: 2011-09-08 | Disposition: A | Discharge status: Home / Self Care | Payer: BC Managed Care – PPO | Attending: Emergency Medicine | Admitting: Emergency Medicine

## 2011-09-08 ENCOUNTER — Emergency Department (HOSPITAL_COMMUNITY): Payer: BC Managed Care – PPO

## 2011-09-08 ENCOUNTER — Encounter (HOSPITAL_COMMUNITY): Payer: Self-pay | Admitting: *Deleted

## 2011-09-08 DIAGNOSIS — R609 Edema, unspecified: Secondary | ICD-10-CM | POA: Insufficient documentation

## 2011-09-08 DIAGNOSIS — S52123A Displaced fracture of head of unspecified radius, initial encounter for closed fracture: Secondary | ICD-10-CM

## 2011-09-08 DIAGNOSIS — M79609 Pain in unspecified limb: Secondary | ICD-10-CM | POA: Insufficient documentation

## 2011-09-08 DIAGNOSIS — S0003XA Contusion of scalp, initial encounter: Secondary | ICD-10-CM | POA: Insufficient documentation

## 2011-09-08 DIAGNOSIS — Z853 Personal history of malignant neoplasm of breast: Secondary | ICD-10-CM | POA: Insufficient documentation

## 2011-09-08 DIAGNOSIS — W010XXA Fall on same level from slipping, tripping and stumbling without subsequent striking against object, initial encounter: Secondary | ICD-10-CM | POA: Insufficient documentation

## 2011-09-08 DIAGNOSIS — S060X9A Concussion with loss of consciousness of unspecified duration, initial encounter: Secondary | ICD-10-CM | POA: Insufficient documentation

## 2011-09-08 DIAGNOSIS — W19XXXA Unspecified fall, initial encounter: Secondary | ICD-10-CM

## 2011-09-08 DIAGNOSIS — S060XAA Concussion with loss of consciousness status unknown, initial encounter: Secondary | ICD-10-CM | POA: Insufficient documentation

## 2011-09-08 DIAGNOSIS — S1093XA Contusion of unspecified part of neck, initial encounter: Secondary | ICD-10-CM | POA: Insufficient documentation

## 2011-09-08 DIAGNOSIS — R51 Headache: Secondary | ICD-10-CM | POA: Insufficient documentation

## 2011-09-08 DIAGNOSIS — E119 Type 2 diabetes mellitus without complications: Secondary | ICD-10-CM | POA: Insufficient documentation

## 2011-09-08 HISTORY — DX: Malignant (primary) neoplasm, unspecified: C80.1

## 2011-09-08 HISTORY — DX: Disorder of thyroid, unspecified: E07.9

## 2011-09-08 MED ORDER — HYDROCODONE-ACETAMINOPHEN 5-325 MG PO TABS
1.0000 | ORAL_TABLET | Freq: Once | ORAL | Status: AC
  Administered 2011-09-08: 1 via ORAL
  Filled 2011-09-08: qty 1

## 2011-09-08 MED ORDER — HYDROCODONE-ACETAMINOPHEN 5-325 MG PO TABS: 1.0000 | ORAL_TABLET | ORAL | Status: AC | PRN

## 2011-09-08 NOTE — ED Provider Notes (Signed)
History     CSN: [id number]  Arrival date & time 09/08/11  1844   First MD Initiated Contact with Patient 09/08/11 2028      Chief Complaint  Patient presents with  . Fall  . Loss of Consciousness  . Arm Pain   HPI  History provided by the patient. Patient is a 56 year old female with history of diabetes, breast cancer in remission who presents with injuries after a fall. 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 Patient went head first and reports that she was told she had a brief loss of consciousness. Patient complains of pains in her right elbow and forearm area. Patient also reported having slight swelling to her nose and face with a general headache and small amount of epistaxis. Patient has since felt well without any confusion, slurred speech, weakness. She denies any numbness or weakness in extremities. Patient has not taken anything for symptoms. She denies any other aggravating or alleviating factors.     Past Medical History  Diagnosis Date  . Cancer   . Diabetes mellitus   . Thyroid disease     Past Surgical History  Procedure Date  . Breast surgery   . Mastectomy   . Abdominal hysterectomy     No family history on file.  History  Substance Use Topics  . Smoking status: Never Smoker   . Smokeless tobacco: Not on file  . Alcohol Use: No    OB History    Grav Para Term Preterm Abortions TAB SAB Ect Mult Living                  Review of Systems  HENT: Negative for neck pain.   Eyes: Negative for visual disturbance.  Respiratory: Negative for shortness of breath.   Cardiovascular: Negative for chest pain.  Musculoskeletal: Positive for myalgias and joint swelling. Negative for back pain.  Neurological: Positive for headaches. Negative for dizziness and light-headedness.    Allergies  Penicillins  Home Medications     Current Outpatient Rx  Name Route Sig Dispense Refill  . ARMOUR THYROID 15 MG PO TABS Oral Take 45 mg by mouth daily.     . IBUPROFEN 200 MG PO TABS Oral Take 800 mg by mouth every 8 (eight) hours as needed. For pain.    Marland Kitchen METFORMIN HCL ER 500 MG PO TB24 Oral Take 500 mg by mouth daily with breakfast.       BP 141/69  Pulse 77  Temp(Src) 98.6 F (37 C) (Oral)  Resp 16  SpO2 99%  Physical Exam  Nursing note and vitals reviewed. Constitutional: She is oriented to person, place, and time. She appears well-developed and well-nourished. No distress.  HENT:  Head: Normocephalic.       Patient has swelling around the nose without significant tenderness. Small amount dry blood in left naris. No septal hematoma.  Small bruising and abrasion to right for head.  No battle sign or raccoon eyes.  Small abrasions and swelling to the upper and lower lips. Teeth normal without broken or missing teeth.  Neck: Normal range of motion. Neck supple.       No cervical midline tenderness.  Cardiovascular: Normal rate and regular rhythm.   Pulmonary/Chest: Effort normal and breath sounds normal.  Abdominal: Soft. There is no tenderness.  Musculoskeletal: She exhibits edema and tenderness.  Pain over right elbow with reduced range of motion. There is mild swelling to the posterior aspect.  Patient also has tenderness to palpation over right wrist area with mild swelling. No gross deformity. Patient has normal radial pulse. Patient is able to make a fist with secondary pain. Patient has normal distal sensation and cap refill in fingers. No snuffbox tenderness.  Neurological: She is alert and oriented to person, place, and time.  Skin: Skin is warm and dry. No rash noted.  Psychiatric: She has a normal mood and affect. Her behavior is normal.    ED Course  Procedures      Dg Elbow Complete Right  09/08/2011  *RADIOLOGY REPORT*  Clinical Data: Post fall, now with right elbow pain  RIGHT  ELBOW - COMPLETE 3+ VIEW  Comparison: None.  Findings: There is a minimally displaced fracture of the radial head with intra-articular extension and minimal depression of the articular surface.  This is associated with an expected small elbow joint effusion.  No additional fractures are identified.  No radiopaque foreign body.  IMPRESSION: Minimally displaced radial head fracture with intra-articular extension.  Original Report Authenticated By: Waynard Reeds, M.D.   Dg Wrist Complete Right  09/08/2011  *RADIOLOGY REPORT*  Clinical Data: Post fall, now with right hand and wrist pain  RIGHT WRIST - COMPLETE 3+ VIEW  Comparison: Right hand radiographs of earlier same day  Findings: No fracture or dislocation.  No definite displacement of the pronator quadratus fat pad.  The radiocarpal and intercarpal joint spaces are preserved.  No definite evidence of chondrocalcinosis.  Regional soft tissues are normal.  IMPRESSION: No fracture.  If the patient has pain referable to the anatomic snuff box, splinting and a follow-up radiograph in 10 to 14 days is recommended to evaluate for occult scaphoid fracture.  Original Report Authenticated By: Waynard Reeds, M.D.   Ct Head Wo Contrast  09/08/2011  *RADIOLOGY REPORT*  Clinical Data: Larey Seat, hit head, loss of consciousness.  CT HEAD WITHOUT CONTRAST  Technique:  Contiguous axial images were obtained from the base of the skull through the vertex without contrast.  Comparison: None.  Findings: There is no evidence for acute infarction, intracranial hemorrhage, mass lesion, hydrocephalus, or extra-axial fluid. Slight asymmetric atrophy right frontal lobe.  No significant white matter disease.  Calvarium intact.  Clear sinuses and mastoids. Grossly negative orbits. Slight nasal deformity on the left although this is not clearly acute.  IMPRESSION: No acute intracranial findings.  No skull fracture or intracranial hemorrhage.  Slight atrophy.  Original Report Authenticated  By: Elsie Stain, M.D.   Dg Hand Complete Right  09/08/2011  *RADIOLOGY REPORT*  Clinical Data: Fall, now with right hand, wrist and elbow pain  RIGHT HAND - COMPLETE 3+ VIEW  Comparison: None.  Findings: Diffuse osteopenia without definite fracture. Degenerative change, primarily involving the DIP joints of the third, fourth and fifth digits, with joint space loss, subchondral sclerosis and osteophytosis.  Minimal adjacent soft tissue swelling.  No radiopaque foreign body.  IMPRESSION: 1.  No fracture. 2. Distal arthropathy, possibly secondary to osteoarthritis, though erosive arthritis may have a similar appearance.  Original Report Authenticated By: Waynard Reeds, M.D.     1. Radial head fracture   2. Fall   3. Concussion       MDM  Pt Seen and evaluated. Patient no acute distress.  Discuss x-ray and CT findings with patient. Patient incidentally has appointment with her orthopedic doctor  tomorrow. She will see Burman Freestone for Dr. Leslee Home at [facility] orthopedics.  Plan to place patient on her splint and sling and provide prescription for pain medications.          Angus Seller, [place] 09/08/11 2117

## 2011-09-08 NOTE — Discharge Instructions (Signed)
You were seen and evaluated for your injuries after your fall. Your x-rays showed that you have a small fracture to your radial head bone near elbow. You have been placed in a splint and a sling to help with healing and pain. Please followup with your orthopedic doctor tomorrow as planned. Your providers today also feel you sustained a small concussion from your fall. Please read the attached information regarding your diagnosis. If you develop any worsening symptoms, persistent nausea vomiting, confusion, difficulty speaking, weakness or paralysis in your extremities please return to the emergency room.    Concussion and Brain Injury A blow or jolt to the head can disrupt the normal function of the brain. This type of brain injury is often called a "concussion" or a "closed head injury." Concussions are usually not life-threatening. Even so, the effects of a concussion can be serious.  CAUSES  A concussion is caused by a blunt blow to the head. The blow might be direct or indirect as described below.  Direct blow (running into another player during a soccer game, being hit in a fight, or hitting your head on a hard surface).   Indirect blow (when your head moves rapidly and violently back and forth like in a car crash).  SYMPTOMS  The brain is very complex. Every head injury is different. Some symptoms may appear right away. Other symptoms may not show up for days or weeks after the concussion. The signs of concussion can be hard to notice. Early on, problems may be missed by patients, family members, and caregivers. You may look fine even though you are acting or feeling differently.  These symptoms are usually temporary, but may last for days, weeks, or even longer. Symptoms include:  Mild headaches that will not go away.   Having more trouble than usual with:   Remembering things.   Paying attention or concentrating.   Organizing daily tasks.   Making decisions and solving problems.    Slowness in thinking, acting, speaking, or reading.   Getting lost or easily confused.   Feeling tired all the time or lacking energy (fatigue).   Feeling drowsy.   Sleep disturbances.   Sleeping more than usual.   Sleeping less than usual.   Trouble falling asleep.   Trouble sleeping (insomnia).   Loss of balance or feeling lightheaded or dizzy.   Nausea or vomiting.   Numbness or tingling.   Increased sensitivity to:   Sounds.   Lights.   Distractions.  Other symptoms might include:  Vision problems or eyes that tire easily.   Diminished sense of taste or smell.   Ringing in the ears.   Mood changes such as feeling sad, anxious, or listless.   Becoming easily irritated or angry for little or no reason.   Lack of motivation.  DIAGNOSIS  Your caregiver can usually diagnose a concussion or mild brain injury based on your description of your injury and your symptoms.  Your evaluation might include:  A brain scan to look for signs of injury to the brain. Even if the test shows no injury, you may still have a concussion.   Blood tests to be sure other problems are not present.  TREATMENT   People with a concussion need to be examined and evaluated. Most people with concussions are treated in an emergency department, urgent care, or clinic. Some people must stay in the hospital overnight for further treatment.   Your caregiver will send you home with important instructions  to follow. Be sure to carefully follow them.   Tell your caregiver if you are already taking any medicines (prescription, over-the-counter, or natural remedies), or if you are drinking alcohol or taking illegal drugs. Also, talk with your caregiver if you are taking blood thinners (anticoagulants) or aspirin. These drugs may increase your chances of complications. All of this is important information that may affect treatment.   Only take over-the-counter or prescription medicines for pain,  discomfort, or fever as directed by your caregiver.  PROGNOSIS  How fast people recover from brain injury varies from person to person. Although most people have a good recovery, how quickly they improve depends on many factors. These factors include how severe their concussion was, what part of the brain was injured, their age, and how healthy they were before the concussion.  Because all head injuries are different, so is recovery. Most people with mild injuries recover fully. Recovery can take time. In general, recovery is slower in older persons. Also, persons who have had a concussion in the past or have other medical problems may find that it takes longer to recover from their current injury. Anxiety and depression may also make it harder to adjust to the symptoms of brain injury. HOME CARE INSTRUCTIONS  Return to your normal activities slowly, not all at once. You must give your body and brain enough time for recovery.  Get plenty of sleep at night, and rest during the day. Rest helps the brain to heal.   Avoid staying up late at night.   Keep the same bedtime hours on weekends and weekdays.   Take daytime naps or rest breaks when you feel tired.   Limit activities that require a lot of thought or concentration (brain or cognitive rest). This includes:   Homework or job-related work.   Watching TV.   Computer work.   Avoid activities that could lead to a second brain injury, such as contact or recreational sports, until your caregiver says it is okay. Even after your brain injury has healed, you should protect yourself from having another concussion.   Ask your caregiver when you can return to your normal activities such as driving, bicycling, or operating heavy equipment. Your ability to react may be slower after a brain injury.   Talk with your caregiver about when you can return to work or school.   Inform your teachers, school nurse, school counselor, coach, Event organiser,  or work Production designer, theatre/television/film about your injury, symptoms, and restrictions. They should be instructed to report:   Increased problems with attention or concentration.   Increased problems remembering or learning new information.   Increased time needed to complete tasks or assignments.   Increased irritability or decreased ability to cope with stress.   Increased symptoms.   Take only those medicines that your caregiver has approved.   Do not drink alcohol until your caregiver says you are well enough to do so. Alcohol and certain other drugs may slow your recovery and can put you at risk of further injury.   If it is harder than usual to remember things, write them down.   If you are easily distracted, try to do one thing at a time. For example, do not try to watch TV while fixing dinner.   Talk with family members or close friends when making important decisions.   Keep all follow-up appointments. Repeated evaluation of your symptoms is recommended for your recovery.  PREVENTION  Protect your head from future injury.  It is very important to avoid another head or brain injury before you have recovered. In rare cases, another injury has lead to permanent brain damage, brain swelling, or death. Avoid injuries by using:  Seatbelts when riding in a car.   Alcohol only in moderation.   A helmet when biking, skiing, skateboarding, skating, or doing similar activities.   Safety measures in your home.   Remove clutter and tripping hazards from floors and stairways.   Use grab bars in bathrooms and handrails by stairs.   Place non-slip mats on floors and in bathtubs.   Improve lighting in dim areas.  SEEK MEDICAL CARE IF:  A head injury can cause lingering symptoms. You should seek medical care if you have any of the following symptoms for more than 3 weeks after your injury or are planning to return to sports:  Chronic headaches.   Dizziness or balance problems.   Nausea.   Vision  problems.   Increased sensitivity to noise or light.   Depression or mood swings.   Anxiety or irritability.   Memory problems.   Difficulty concentrating or paying attention.   Sleep problems.   Feeling tired all the time.  SEEK IMMEDIATE MEDICAL CARE IF:  You have had a blow or jolt to the head and you (or your family or friends) notice:  Severe or worsening headaches.   Weakness (even if only in one hand or one leg or one part of the face), numbness, or decreased coordination.   Repeated vomiting.   Increased sleepiness or passing out.   One black center of the eye (pupil) is larger than the other.   Convulsions (seizures).   Slurred speech.   Increasing confusion, restlessness, agitation, or irritability.   Lack of ability to recognize people or places.   Neck pain.   Difficulty being awakened.   Unusual behavior changes.   Loss of consciousness.  Older adults with a brain injury may have a higher risk of serious complications such as a blood clot on the brain. Headaches that get worse or an increase in confusion are signs of this complication. If these signs occur, see a caregiver right away. MAKE SURE YOU:   Understand these instructions.   Will watch your condition.   Will get help right away if you are not doing well or get worse.  FOR MORE INFORMATION  Several groups help people with brain injury and their families. They provide information and put people in touch with local resources. These include support groups, rehabilitation services, and a variety of health care professionals. Among these groups, the Brain Injury Association (BIA, www.biausa.org) has a Secretary/administrator that gathers scientific and educational information and works on a national level to help people with brain injury.  Document Released: 06/20/2003 Document Revised: 03/19/2011 Document Reviewed: 11/16/2007 [facility] Patient Information 2012 [place], [place].     Radial Head  Fracture A radial head fracture is a break of the smaller bone (radius) in the forearm. The head of this bone is the part near the elbow. These fractures commonly happen during a fall when you land on the outstretched arm. These fractures are more common in middle aged adults and are common with a dislocation of the elbow. SYMPTOMS   Swelling of the elbow joint and pain on the outside of the elbow.   Pain and difficulty in bending or straightening the elbow.   Pain and difficulty in turning the palm of the hand up or down with the elbow  bent.  DIAGNOSIS  Your caregiver may make this diagnosis by a physical exam. X-rays can confirm the type and amount of break. Sometimes a break which is not displaced cannot be seen on the original x-ray. TREATMENT  Radial head fractures are classified according to the amount of movement (displacement) of parts from the normal position.  Type 1 Fractures  Type 1 fractures are generally small fractures in which bone pieces remain together (non-displaced fracture).   The fracture may not be seen on initial X-rays. Usually if x-rays are repeated two to three weeks later, the fracture will show up. A splint or sling is used for a few days. Gentle early motion is used to prevent the elbow from becoming stiff. It should not be done vigorously or forced as this could displace the bone pieces.  Type 2 Fractures  With type 2 fractures, bone pieces are slightly displaced and larger pieces of bone are broken off.   If only a little displacement of the bone piece is present, splinting for 4 to 5 days usually works well. This is again followed with gentle active range of motion. Small fragments may be surgically removed.   Large pieces of bone that can be put back into place will sometimes be fixed with pins or screws to hold them until the bone is healed. If this cannot be done, the fragments are removed. For older, less active people, sometimes the entire radial head is  removed if the wrist is not injured. The elbow and arm will still work fine. Soft tissue, tendon, and ligament injuries are corrected at the same time.  Type 3 Fractures  Type 3 fractures have multiple broken pieces of bone which cannot be fixed. Surgery is usually needed to remove the broken bits of bone and what is left of the radial head. Soft-tissue damage is repaired. Gentle early motion is used to prevent the elbow from becoming stiff. Sometimes an artificial radial head can be used to prevent deformity if elbow is instable.  Rest, ice, elevation, immobilization, medications, and pain control are used in the early care. HOME CARE INSTRUCTIONS   Keep the injured part elevated while sitting or lying down. Keep the injury above the level of your heart (the center of the chest). This will decrease swelling and pain.   Apply ice to the injury for 15 to 20 minutes, 3 to 4 times per day while awake, for 2 days. Put the ice in a plastic bag and place a towel between the bag of ice and your cast or splint.   Move your fingers to avoid stiffness and minimize swelling.   If you have a plaster or fiberglass cast:   Do not try to scratch the skin under the cast using sharp or pointed objects.   Check the skin around the cast every day. You may put lotion on any red or sore areas.   Keep your cast dry and clean.   If you have a plaster splint:   Wear the splint as directed.   You may loosen the elastic around the splint if your fingers become numb, tingle, or turn cold or blue.   Do not put pressure on any part of your cast or splint. It may break. Rest your cast only on a pillow for the first 24 hours until it is fully hardened.   Your cast or splint can be protected during bathing with a plastic bag. Do not lower the cast or splint into water.  Only take over-the-counter or prescription medicines for pain, discomfort, or fever as directed by your caregiver.   Follow all instructions for  follow up with your caregiver. This includes any orthopedic referrals, physical therapy and rehabilitation. Any delay in obtaining necessary care could result in a delay or failure of the bones to heal or permanent elbow stiffness.   Do not over do exercises. This could further damage your injury.  SEEK IMMEDIATE MEDICAL CARE IF:   Your cast or splint gets damaged or breaks.   You have more severe pain or swelling than you did before getting the cast.   You have severe pain when stretching your fingers.   There is a bad smell, new stains and/or pus-like (purulent) drainage coming from under the cast.   Your fingers or hand turn pale or blue, become cold, or you lose feeling.  Document Released: 01/19/2006 Document Revised: 03/19/2011 Document Reviewed: 02/26/2009 [facility] Patient Information 2012 [place], [place].    RESOURCE GUIDE  Dental Problems  Patients with Medicaid: [facility] [phone] [address]                                           [phone] [address] Phone:  [phone]                                                  Phone:  [phone]  If unable to pay or uninsured, contact:  [facility] or [facility]. to become qualified for the adult dental clinic.  Chronic Pain Problems Contact [facility]  [phone] Patients need to be referred by their primary care doctor.  Insufficient Money for Medicine Contact [employer] Way:  call "211" or [facility] [phone].  No Primary Care Doctor Call [facility]  [phone] Other agencies that provide inexpensive medical care    [facility]  [phone]    [facility]  [phone]    [facility]  [phone]    [facility]  [phone]    [facility]  [phone]    [facility]  [phone]  Psychological Services [facility] Behavioral Health  [phone] [facility] Services  [phone] [facility] Mental [facility]   [phone] (emergency services ([phone])  Substance Abuse Resources [facility] Services  [phone] [facility] 970-522-2211 [facility] [phone] Floyd[facility] Flock [phone] Residential & Outpatient Substance Abuse Program  3196861289  Abuse/Neglect [facility] Child Abuse Hotline 214-700-3893 [facility] Child Abuse Hotline 206-218-0339 (After Hours)  Emergency Shelter [facility] 216-177-2927  Maternity Homes [facility] [phone] [facility] Services 978-471-6321  MRSA Hotline #:   4631144243    [facility] Resources  [facility]     [employer]                          [facility] Dept. [address] [place] Phone:  [phone]                                   Phone:  [phone]                 Phone:  [place] Phone:  [place] [phone] (After Hours)

## 2011-09-08 NOTE — ED Notes (Signed)
Pt reports tripping approx 12 today. Witnessed fall and hit head on concrete, loc after this, thinks she was "out" for a few minutes. C/o pain to R hand, wrist, and arm. Swelling noted. Ice pack applied.

## 2011-09-09 NOTE — ED Provider Notes (Signed)
Medical screening examination/treatment/procedure(s) were performed by non-physician practitioner and as supervising physician I was immediately available for consultation/collaboration.  Ia Leeb T Ladonne Sharples, MD 09/09/11 2319 

## 2011-09-23 ENCOUNTER — Ambulatory Visit: Payer: BC Managed Care – PPO | Admitting: *Deleted

## 2011-11-05 ENCOUNTER — Encounter: Payer: BC Managed Care – PPO | Attending: Endocrinology | Admitting: Dietician

## 2011-11-05 ENCOUNTER — Encounter: Payer: Self-pay | Admitting: Dietician

## 2011-11-05 VITALS — Ht 70.0 in | Wt 193.3 lb

## 2011-11-05 DIAGNOSIS — Z713 Dietary counseling and surveillance: Secondary | ICD-10-CM | POA: Insufficient documentation

## 2011-11-05 DIAGNOSIS — E119 Type 2 diabetes mellitus without complications: Secondary | ICD-10-CM

## 2011-11-05 NOTE — Progress Notes (Signed)
  Medical Nutrition Therapy:  Appt start time: 1030 end time:  1200.   Assessment:  Primary concerns today: To learn how to best/better deal blood glucose levels and high cholesterol. New onset of DM 2 with a HgA1C of 6.3%.  Her mother has diabetes.  She has a history of a LF breast mass with chemo, surgery , and reconstruction surgery.  In June of this year, experienced a syncopal episode and fell braking her RT arm.  She is recovering and still has some discomfort in the arm.  Is anxious to learn how to control the blood glucose levels, to get her bad cholesterol level lower using diet rather than medication.  Notes the earlier in the year, was prescribed Abilify and experienced a 15 lb weight gain in a matter of a few weeks.  She is no longer taking this medication.  Given her recent cancer experience, she is about trying to limit her medicines.    BLOOD GLUCOSE:  Not currently monitoring  HYPOGLYCEMIA:  No noted S/S of low blood glucose.  The syncopal episode might be remotely related but does not give a history for a positive correlation.  HYPERGLYCEMIA: Gives no history of S/S that would indicate marked blood glucose elevations.  MEDICATIONS: med review completed.  DM type 2 med includes Metformin 500 mg ER daily.   DIETARY INTAKE:  Usual eating pattern includes 3 meals and some snacks per day.  Everyday foods include: meats, proteins, fruits, vegetables and starches.  Avoided foods include shrimp, and cow's milk, uses lactaid.   24-hr recall:  B ( AM): 8:30  Oatmeal 1 cup, with butter, honey, blueberries. sometimes cinnamon, raisin bran, bread (raisin toast) or breakfast bar (soy)  Coffee.  Snk ( AM): generally none  L ( PM): 1:00 [place], ham, bacon, lettuce, tomato on whole wheat sub and water.Most days, will have chicken salad and fruit and green tea, Lipton green citrus and diet tea. Snk ( PM): sometimes goldfish or ritz cracker aim for fruit sometimes. D ( PM): 5:30-6:00  chick filet-a-et chicken salad plain and fruit cup lemonade or lipton green tea or water. Snk ( PM): not usually, get chocolate Beverages: lipton green tea, diet, coffee, green tea.  Usual physical activity: swimming, walking, but not currently active given recovery from arm fracture and continued healing discomfort.  Hopes to get back to exercise in the future.  Estimated energy needs:HT: 70 in  WT: 193.3 lb  BMI: 27.8 kg/m2  Adj. WT:  1400-1500 calories 160-165 g carbohydrates 105-110 g protein 38-40 g fat  Progress Towards Goal(s):  In progress.   Nutritional Diagnosis:  [place]-2.1 Inpaired nutrition utilization As related to blood glucose.  As evidenced by new diagnosis of type 2 diabetes, A1C of 6.3%..    Intervention:  Nutrition review of the carbohydrate limiting diet for blood glucose control as well as the need to restrict saturated fats and cholesterol for the prevention of cardiovascular complications.  Handouts given during visit include:  Living well with Diabetes  Controlling Blood Glucose  Menu suggestions for 30 and 45 gm meals and snacks  Snack list  Yellow card with exchanges  Increasing HDL and Lowering LDL and Triglycerides  Monitoring/Evaluation:  Dietary intake, exercise, and body weight to call with questions and to follow-up in 8-12 weeks.  Marland Kitchen

## 2011-11-05 NOTE — Patient Instructions (Addendum)
   Calcium citrate is more readily absorbed than the calcium carbonate.  Eat regular meals and snacks.  Omit the sugar sweetened beverages.  Consider the use of the sugar substitutes such as Stevia and Splenda.  Use these in moderation.  Increase fiber to help with the slowing of the movement of glucose into the blood stream.  Aim for 2 gm of fiber per serving of bread and with cereal, aim for at least 3 gm of fiber or more per serving of cereal  Try to limit the intake of sugar.  For most of the day, try to keep the sugar level on the product label at 0-9 gm of sugar.   Try to use less salt.  Start with the concept of not adding salt to a product/dish.  In time start to monitor the sodium found in the processed meats and prepared/frozen meals.  Protein/meats:  Make lean choices that contain less marbling and fat.  Bake, broil, grill, roast, steam, try to avoid frying that will add increased amounts of fat back into the product.  Back the chicken or poultry with the skin on, take it off.  The skin is where the majority of the fat is located.  Increase your intake of the non-starchy free vegetables.  Keep the protein serving at mealtime to size of the palm of the hand and the snack to approximately 1 oz.    Try to limit added fat to 1-2 servings per meal (salad dressing, butter, sour cream, oils).  Aim for 30-45 gm of Carbohydrates per meal.  To calculate use the exchange list on the back of the yellow card, the food label if you have it and use the calorieking.com web site if available.

## 2011-11-08 ENCOUNTER — Encounter: Payer: Self-pay | Admitting: Dietician

## 2012-08-16 ENCOUNTER — Telehealth: Payer: Self-pay | Admitting: Oncology

## 2012-08-17 ENCOUNTER — Other Ambulatory Visit (HOSPITAL_BASED_OUTPATIENT_CLINIC_OR_DEPARTMENT_OTHER): Payer: BC Managed Care – PPO | Admitting: Lab

## 2012-08-17 DIAGNOSIS — C50919 Malignant neoplasm of unspecified site of unspecified female breast: Secondary | ICD-10-CM

## 2012-08-17 DIAGNOSIS — C50219 Malignant neoplasm of upper-inner quadrant of unspecified female breast: Secondary | ICD-10-CM

## 2012-08-17 LAB — COMPREHENSIVE METABOLIC PANEL (CC13)
BUN: 15.3 mg/dL (ref 7.0–26.0)
CO2: 24 mEq/L (ref 22–29)
Calcium: 9.4 mg/dL (ref 8.4–10.4)
Chloride: 106 mEq/L (ref 98–107)
Creatinine: 0.7 mg/dL (ref 0.6–1.1)
Glucose: 108 mg/dl — ABNORMAL HIGH (ref 70–99)
Total Bilirubin: 1.06 mg/dL (ref 0.20–1.20)

## 2012-08-17 LAB — CBC WITH DIFFERENTIAL/PLATELET
Basophils Absolute: 0 10*3/uL (ref 0.0–0.1)
Eosinophils Absolute: 0.1 10*3/uL (ref 0.0–0.5)
HCT: 39.8 % (ref 34.8–46.6)
HGB: 13.7 g/dL (ref 11.6–15.9)
LYMPH%: 33.5 % (ref 14.0–49.7)
MCHC: 34.3 g/dL (ref 31.5–36.0)
MONO#: 0.5 10*3/uL (ref 0.1–0.9)
NEUT#: 2.5 10*3/uL (ref 1.5–6.5)
NEUT%: 52 % (ref 38.4–76.8)
Platelets: 316 10*3/uL (ref 145–400)
WBC: 4.7 10*3/uL (ref 3.9–10.3)
lymph#: 1.6 10*3/uL (ref 0.9–3.3)

## 2012-08-18 ENCOUNTER — Other Ambulatory Visit (HOSPITAL_COMMUNITY)
Admission: RE | Admit: 2012-08-18 | Discharge: 2012-08-18 | Disposition: A | Discharge status: Home / Self Care | Payer: BC Managed Care – PPO | Source: Ambulatory Visit | Attending: Family Medicine | Admitting: Family Medicine

## 2012-08-18 ENCOUNTER — Other Ambulatory Visit: Payer: Self-pay | Admitting: Family Medicine

## 2012-08-18 ENCOUNTER — Other Ambulatory Visit: Payer: BC Managed Care – PPO | Admitting: Lab

## 2012-08-18 DIAGNOSIS — Z01419 Encounter for gynecological examination (general) (routine) without abnormal findings: Secondary | ICD-10-CM | POA: Insufficient documentation

## 2012-08-25 ENCOUNTER — Ambulatory Visit (HOSPITAL_BASED_OUTPATIENT_CLINIC_OR_DEPARTMENT_OTHER): Payer: BC Managed Care – PPO | Admitting: Oncology

## 2012-08-25 ENCOUNTER — Telehealth: Payer: Self-pay | Admitting: *Deleted

## 2012-08-25 VITALS — BP 143/84 | HR 83 | Temp 98.6°F | Resp 20 | Ht 70.0 in | Wt 194.3 lb

## 2012-08-25 DIAGNOSIS — C50919 Malignant neoplasm of unspecified site of unspecified female breast: Secondary | ICD-10-CM

## 2012-08-25 DIAGNOSIS — C50912 Malignant neoplasm of unspecified site of left female breast: Secondary | ICD-10-CM

## 2012-08-25 DIAGNOSIS — M7989 Other specified soft tissue disorders: Secondary | ICD-10-CM

## 2012-08-25 MED ORDER — VENLAFAXINE HCL ER 37.5 MG PO CP24: 37.5000 mg | ORAL_CAPSULE | Freq: Every day | ORAL | Status: DC

## 2012-08-25 NOTE — Progress Notes (Signed)
ID: Brenton Grills   DOB: 03-Jan-1956  MR#: [id number]  CSN#:[id number]  PCP: Cala Bradford, MD GYN: SU: OTHER MD: Dorisann Frames  HISTORY OF PRESENT ILLNESS: She noted a lump in her left breast in the fall of 2009 and had mammography at [facility] with Cassandria Anger on February 17, 2008.  He found the breast to be heterogeneously dense with no interval change by mammography.  Ultrasound showed an oval, well circumscribed hypoechoic mass measuring 1 cm in the 10'clock position, 2 cm from the nipple in the left breast corresponding to the palpable mass.  This was felt to be benign appearing fibrocystic change and a six-month mammogram was suggested.  In January of 2010 she saw Dr. Chevis Pretty who palpated the mass and he referred her to Dr. Jaclynn Guarneri for further evaluation.  Dr. Johna Sheriff reviewed the data and offered the patient further observation as already discussed versus excision and the patient chose the latter.  Her excisional biopsy was performed May 28, 2008 and showed an invasive ductal carcinoma, grade 3, measuring 7 mm, with good margins for the invasive component, but what looked like positive margins in the in situ component.  No lymphovascular invasion and a prognostic panel which showed the tumor to be ER and PR negative with a proliferation marker of 34% and HER2/neu negative by CISH with a ratio of 1.21 (PM10-121 and OS10-2305).  With this information, the patient proceeded to bilateral breast MRIs on March 4 with this showed only a seroma in the upper inner quadrant of the left breast measuring 4.9 cm, but otherwise no significant findings on either side and no evidence of internal mammary or axillary lymph node enhancement.  On June 21, 2008 the patient underwent reexcision together with left axillary lymph node biopsy ([id number]) a negative sentinel lymph node with the inferior margin still showing a microscopic focus of high-grade ductal carcinoma with the deep margins still showing focal  involvement by ductal carcinoma in situ.  All the other margins were negative.   Her subsequent history is as detailed below.  INTERVAL HISTORY: Aeriana returns today for routine followup of her breast cancer. History is generally unremarkable, except that her husband finally found a job in [facility] and they may end up moving here over the next several months.  REVIEW OF SYSTEMS: She is feeling a little overwhelmed and depressed. She specifically denies suicidal followups and there were many issues, the idea of selling her house and moving, and of course concerns regarding cancer. She is not getting things down the way she wants. She is "slow". She is having something like panic attacks. In addition she has a feeling of fullness in the left arm and axilla. She wanted me to check that. She describes herself as mildly fatigued. She still has shooting pains in the left breast, which are going to be as she is aware postop (but can last for years on and off). She has mild bladder spasms. She bruises easily. She has some arthritis involving her hands and other joints. These are on and off. She tells me her diabetes is not well-controlled. Hot flashes are still severe. A detailed review of systems today was otherwise noncontributory.  PAST MEDICAL HISTORY: Significant for hysterectomy with bilateral salpingo-oophorectomy in 2006 "I kept my cervix."  She has a history of mitral valve prolapse not requiring antibiotics before dental procedures.  She has a history of hypothyroidism, history of migraines, history of mild osteoarthritis particularly involving the hands.  She has a  history of in vitro fertilization remotely. New diagnosis of diabetes  FAMILY HISTORY The patient's father died at the age of 47 in the setting of prostate cancer.  The patient's mother is alive in her 52s.  The patient is one of four siblings.  There is no history of breast or ovarian cancer in the family.  GYNECOLOGIC  HISTORY: She is Gx, P2 first pregnancy at age 33.  She was still menstruating in 2006.  She did not take hormones after the hysterectomy and bilateral salpingo-oophorectomy.    SOCIAL HISTORY: She is a Futures trader.  Her husband Freida Busman works in Consulting civil engineer mostly in [place] and does a lot of traveling.  Both her sons are currently in college.  The patient attends a [employer].   ADVANCED DIRECTIVES: in place  HEALTH MAINTENANCE: History  Substance Use Topics  . Smoking status: Never Smoker   . Smokeless tobacco: Never Used  . Alcohol Use: No     Allergies  Allergen Reactions  . Penicillins   . Lactose Intolerance (Gi)     Does not use dairy without using lactaide  . Shrimp (Shellfish Allergy) Hives    Current Outpatient Prescriptions  Medication Sig Dispense Refill  . ARMOUR THYROID 15 MG tablet Take 45 mg by mouth daily.       Marland Kitchen ibuprofen (ADVIL,MOTRIN) 200 MG tablet Take 800 mg by mouth every 8 (eight) hours as needed. For pain.      . metFORMIN (GLUCOPHAGE-XR) 500 MG 24 hr tablet Take 500 mg by mouth daily with breakfast.        No current facility-administered medications for this visit.    OBJECTIVE: Middle-aged white woman who appears well Filed Vitals:   08/25/12 0933  BP: 143/84  Pulse: 83  Temp: 98.6 F (37 C)  Resp: 20     Body mass index is 27.88 kg/(m^2).    ECOG FS: 1  Sclerae unicteric Oropharynx clear No cervical or supraclavicular adenopathy Lungs no rales or rhonchi Heart regular rate and rhythm Abd benign MSK no focal spinal tenderness, no peripheral edema Neuro: nonfocal, well oriented, pleasant affect Breasts: She is status post bilateral mastectomies with bilateral TRAM reconstruction. The cosmetic result is excellent. In the left axilla I do feel a 1.5 cm mass, which may well be a lymph node. The right breast and right axilla are unremarkable  LAB RESULTS: Lab Results  Component Value Date   WBC 4.7 08/17/2012   NEUTROABS 2.5 08/17/2012    HGB 13.7 08/17/2012   HCT 39.8 08/17/2012   MCV 93.9 08/17/2012   PLT 316 08/17/2012      Chemistry      Component Value Date/Time   NA 140 08/17/2012 0834   NA 142 08/18/2011 0909   K 4.2 08/17/2012 0834   K 3.9 08/18/2011 0909   CL 106 08/17/2012 0834   CL 106 08/18/2011 0909   CO2 24 08/17/2012 0834   CO2 25 08/18/2011 0909   BUN 15.3 08/17/2012 0834   BUN 14 08/18/2011 0909   CREATININE 0.7 08/17/2012 0834   CREATININE 0.72 08/18/2011 0909      Component Value Date/Time   CALCIUM 9.4 08/17/2012 0834   CALCIUM 9.6 08/18/2011 0909   ALKPHOS 103 08/17/2012 0834   ALKPHOS 89 08/18/2011 0909   AST 25 08/17/2012 0834   AST 24 08/18/2011 0909   ALT 27 08/17/2012 0834   ALT 19 08/18/2011 0909   BILITOT 1.06 08/17/2012 0834   BILITOT 1.0 08/18/2011 0909  Lab Results  Component Value Date   LABCA2 32 08/18/2011    No components found with this basename: [id number]    No results found for this basename: INR,  in the last 168 hours  Urinalysis No results found for this basename: colorurine,  appearanceur,  labspec,  phurine,  glucoseu,  hgbur,  bilirubinur,  ketonesur,  proteinur,  urobilinogen,  nitrite,  leukocytesur    STUDIES: No results found.   ASSESSMENT: 57 y.o. [place] woman status post left breast biopsy March 2010 for a triple negative invasive ductal carcinoma, T1bN0 (stage IA) , grade 3, treated adjuvantly with Cytoxan and Taxotere x4, completed in June 2010, followed by bilateral mastectomies and bilateral gluteal artery perforator flap reconstruction.   PLAN: As far as her left axillary at change, I am sending her to the breast Center for ultrasound. If there is anything there we will biopsy it. I have made her a return appointment here in 2 weeks just in case there is something to discuss. Otherwise she will see me again one more time a year from now which time we will plan on "graduation".  I do think she is borderline depressed, and somewhat overwhelmed by all her life changes. In addition  she is having severe hot flashes. I am starting her on venlafaxine at 37.5 milligrams daily and if after 2 weeks she does not note an improvement we will go to 75. She knows to call for any problems that may develop before the next visit.  Henley Blyth C    08/25/2012

## 2012-08-25 NOTE — Telephone Encounter (Signed)
appts made and printed...td 

## 2012-08-30 ENCOUNTER — Other Ambulatory Visit: Payer: Self-pay | Admitting: Oncology

## 2012-08-30 ENCOUNTER — Other Ambulatory Visit: Payer: Self-pay | Admitting: *Deleted

## 2012-08-30 DIAGNOSIS — Z9013 Acquired absence of bilateral breasts and nipples: Secondary | ICD-10-CM

## 2012-08-30 DIAGNOSIS — Z853 Personal history of malignant neoplasm of breast: Secondary | ICD-10-CM

## 2012-08-30 NOTE — Progress Notes (Signed)
Pt called to this RN to state she is scheduled for U/S of L axilla due to new palpable nodule but now has an palpable nodule ( smaller ) in R axilla.  Per discussion with pt verified hx of bilateral mastectomies with transflap reconstruction.  Called to the [facility] and order revised to obtain U/S of both axillas at scheduled appt 5/21.  Pt made aware of the above.

## 2012-08-31 ENCOUNTER — Other Ambulatory Visit: Payer: BC Managed Care – PPO

## 2012-08-31 ENCOUNTER — Telehealth: Payer: Self-pay | Admitting: *Deleted

## 2012-08-31 ENCOUNTER — Ambulatory Visit
Admission: RE | Admit: 2012-08-31 | Discharge: 2012-08-31 | Disposition: A | Discharge status: Home / Self Care | Payer: BC Managed Care – PPO | Source: Ambulatory Visit | Attending: Oncology | Admitting: Oncology

## 2012-08-31 DIAGNOSIS — Z853 Personal history of malignant neoplasm of breast: Secondary | ICD-10-CM

## 2012-08-31 DIAGNOSIS — Z9013 Acquired absence of bilateral breasts and nipples: Secondary | ICD-10-CM

## 2012-08-31 NOTE — Telephone Encounter (Signed)
Pt called requesting to cancel her appt for 09/09/12@ 11am. appt for 09/09/12 was cancel...td

## 2012-09-09 ENCOUNTER — Ambulatory Visit: Payer: BC Managed Care – PPO | Admitting: Oncology

## 2012-09-12 ENCOUNTER — Other Ambulatory Visit: Payer: Self-pay | Admitting: Dermatology

## 2012-10-31 ENCOUNTER — Encounter (HOSPITAL_COMMUNITY): Payer: Self-pay | Admitting: Emergency Medicine

## 2012-10-31 ENCOUNTER — Emergency Department (HOSPITAL_COMMUNITY): Payer: BC Managed Care – PPO

## 2012-10-31 ENCOUNTER — Emergency Department (HOSPITAL_COMMUNITY)
Admission: EM | Admit: 2012-10-31 | Discharge: 2012-10-31 | Disposition: A | Discharge status: Home / Self Care | Payer: BC Managed Care – PPO | Attending: Emergency Medicine | Admitting: Emergency Medicine

## 2012-10-31 DIAGNOSIS — IMO0002 Reserved for concepts with insufficient information to code with codable children: Secondary | ICD-10-CM | POA: Insufficient documentation

## 2012-10-31 DIAGNOSIS — Z79899 Other long term (current) drug therapy: Secondary | ICD-10-CM | POA: Insufficient documentation

## 2012-10-31 DIAGNOSIS — S99929A Unspecified injury of unspecified foot, initial encounter: Secondary | ICD-10-CM | POA: Insufficient documentation

## 2012-10-31 DIAGNOSIS — S6990XA Unspecified injury of unspecified wrist, hand and finger(s), initial encounter: Secondary | ICD-10-CM | POA: Insufficient documentation

## 2012-10-31 DIAGNOSIS — S022XXA Fracture of nasal bones, initial encounter for closed fracture: Secondary | ICD-10-CM

## 2012-10-31 DIAGNOSIS — Z853 Personal history of malignant neoplasm of breast: Secondary | ICD-10-CM | POA: Insufficient documentation

## 2012-10-31 DIAGNOSIS — M1710 Unilateral primary osteoarthritis, unspecified knee: Secondary | ICD-10-CM | POA: Insufficient documentation

## 2012-10-31 DIAGNOSIS — M179 Osteoarthritis of knee, unspecified: Secondary | ICD-10-CM

## 2012-10-31 DIAGNOSIS — Y998 Other external cause status: Secondary | ICD-10-CM | POA: Insufficient documentation

## 2012-10-31 DIAGNOSIS — Y93H9 Activity, other involving exterior property and land maintenance, building and construction: Secondary | ICD-10-CM | POA: Insufficient documentation

## 2012-10-31 DIAGNOSIS — E785 Hyperlipidemia, unspecified: Secondary | ICD-10-CM | POA: Insufficient documentation

## 2012-10-31 DIAGNOSIS — Y92009 Unspecified place in unspecified non-institutional (private) residence as the place of occurrence of the external cause: Secondary | ICD-10-CM | POA: Insufficient documentation

## 2012-10-31 DIAGNOSIS — T148XXA Other injury of unspecified body region, initial encounter: Secondary | ICD-10-CM

## 2012-10-31 DIAGNOSIS — R220 Localized swelling, mass and lump, head: Secondary | ICD-10-CM | POA: Insufficient documentation

## 2012-10-31 DIAGNOSIS — Z7982 Long term (current) use of aspirin: Secondary | ICD-10-CM | POA: Insufficient documentation

## 2012-10-31 DIAGNOSIS — W010XXA Fall on same level from slipping, tripping and stumbling without subsequent striking against object, initial encounter: Secondary | ICD-10-CM | POA: Insufficient documentation

## 2012-10-31 DIAGNOSIS — Z880 Allergy status to penicillin: Secondary | ICD-10-CM | POA: Insufficient documentation

## 2012-10-31 DIAGNOSIS — R221 Localized swelling, mass and lump, neck: Secondary | ICD-10-CM | POA: Insufficient documentation

## 2012-10-31 DIAGNOSIS — E119 Type 2 diabetes mellitus without complications: Secondary | ICD-10-CM | POA: Insufficient documentation

## 2012-10-31 DIAGNOSIS — E079 Disorder of thyroid, unspecified: Secondary | ICD-10-CM | POA: Insufficient documentation

## 2012-10-31 DIAGNOSIS — W19XXXA Unspecified fall, initial encounter: Secondary | ICD-10-CM

## 2012-10-31 DIAGNOSIS — S8990XA Unspecified injury of unspecified lower leg, initial encounter: Secondary | ICD-10-CM | POA: Insufficient documentation

## 2012-10-31 MED ORDER — OXYMETAZOLINE HCL 0.05 % NA SOLN
1.0000 | Freq: Once | NASAL | Status: AC
  Administered 2012-10-31: 1 via NASAL
  Filled 2012-10-31: qty 15

## 2012-10-31 MED ORDER — OXYCODONE-ACETAMINOPHEN 5-325 MG PO TABS: 1.0000 | ORAL_TABLET | ORAL | Status: DC | PRN

## 2012-10-31 NOTE — ED Provider Notes (Signed)
History    CSN: [id number] Arrival date & time 10/31/12  1221  First MD Initiated Contact with Patient 10/31/12 1319     Chief Complaint  Patient presents with  . Fall   (Consider location/radiation/quality/duration/timing/severity/associated sxs/prior Treatment) HPI Comments: Patient reports she was cleaning out her pool, thought she saw a snake and ran away, and tripped and fell on the pavement.  States she tripped in a sloped place she has tripped previously.  Reports she put out her left hand to catch herself, slid on her left hand and left elbow, landed on her knees, and was unable to stop herself before she hit her nose on the pavement.  Notes her nose bled for more than an hour before spontaneously resolving.   Reports pain and swelling in left hand, abrasion but no deep pain in left elbow, bilateral knee pain and bruising.  States she has been taking 1-2 full strength aspirins recently for joint pain and was concerned about bleeding. Denies hitting face or head, denies LOC, denies focal neurological deficits.  Denies pain anywhere else except identified areas.  Denies malocclusion.    Patient is a 57 y.o. female presenting with fall. The history is provided by the patient.  Fall Pertinent negatives include no abdominal pain, chest pain, coughing, fever, nausea, neck pain, numbness, vomiting or weakness.   Past Medical History  Diagnosis Date  . Cancer   . Diabetes mellitus   . Thyroid disease   . Hyperlipidemia    Past Surgical History  Procedure Laterality Date  . Breast surgery    . Mastectomy    . Abdominal hysterectomy    . Arm fracture     Family History  Problem Relation Age of Onset  . Diabetes Mother   . Stroke Mother   . Hypertension Mother   . Cancer Father   . Heart attack Father    History  Substance Use Topics  . Smoking status: Never Smoker   . Smokeless tobacco: Never Used  . Alcohol Use: No   OB History   Grav Para Term Preterm Abortions TAB  SAB Ect Mult Living                 Review of Systems  Constitutional: Negative for fever.  HENT: Negative for neck pain.   Eyes: Negative for visual disturbance.  Respiratory: Negative for cough and shortness of breath.   Cardiovascular: Negative for chest pain.  Gastrointestinal: Negative for nausea, vomiting, abdominal pain, diarrhea and blood in stool.  Genitourinary: Negative for hematuria.  Musculoskeletal: Negative for back pain.  Skin: Positive for wound.  Neurological: Negative for dizziness, syncope, weakness, light-headedness and numbness.  Hematological: Bruises/bleeds easily.  Psychiatric/Behavioral: Negative for confusion.    Allergies  Penicillins; Lactose intolerance (gi); and Shrimp  Home Medications   Current Outpatient Rx  Name  Route  Sig  Dispense  Refill  . ARMOUR THYROID 15 MG tablet   Oral   Take 60 mg by mouth daily.          Marland Kitchen aspirin 325 MG tablet   Oral   Take 325 mg by mouth 2 (two) times daily.         Marland Kitchen doxycycline (ORACEA) 40 MG capsule   Oral   Take 40 mg by mouth daily.         Marland Kitchen ibuprofen (ADVIL,MOTRIN) 200 MG tablet   Oral   Take 800 mg by mouth every 8 (eight) hours as needed. For pain.         Marland Kitchen  metFORMIN (GLUCOPHAGE-XR) 500 MG 24 hr tablet   Oral   Take 500 mg by mouth daily with breakfast.          . venlafaxine XR (EFFEXOR XR) 37.5 MG 24 hr capsule   Oral   Take 1 capsule (37.5 mg total) by mouth daily.   30 capsule   12    BP 159/82  Pulse 89  Temp(Src) 98 F (36.7 C) (Oral)  SpO2 97% Physical Exam  Nursing note and vitals reviewed. Constitutional: She is oriented to person, place, and time. She appears well-developed and well-nourished. No distress.  HENT:  Head: Normocephalic.    Nasal swelling and tenderness.  Maxilla and frontal bones nontender.    Neck: Neck supple.  Cardiovascular: Normal rate and regular rhythm.   Pulmonary/Chest: Effort normal and breath sounds normal. No respiratory  distress. She has no wheezes. She has no rales.  Abdominal: Soft. She exhibits no distension. There is no tenderness. There is no rebound and no guarding.  Musculoskeletal:       Hands: Extremities:  Strength 5/5, sensation intact, distal pulses intact.     Left elbow with abrasion.  No bony tenderness.  Left shoulder nontender.  Right arm without tenderness.   Thorough examination of extremities shows no bony tenderness, except where noted.  No crepitus.    Neurological: She is alert and oriented to person, place, and time. No cranial nerve deficit. She exhibits normal muscle tone. Coordination normal.  CN II-XII intact, EOMs intact, no pronator drift, grip strengths equal bilaterally; strength 5/5 in all extremities, sensation intact in all extremities; finger to nose, heel to shin, rapid alternating movements normal.    Skin: She is not diaphoretic.    ED Course  Procedures (including critical care time) Labs Reviewed - No data to display Dg Nasal Bones  10/31/2012   *RADIOLOGY REPORT*  Clinical Data: Fall  NASAL BONES - 3+ VIEW  Comparison: None  Findings: Slight nasal septal deviation to the right. Paranasal sinuses clear. Minimally depressed distal nasal bone fractures.  IMPRESSION: Minimally depressed distal nasal bone fractures.   Original Report Authenticated By: Ulyses Southward, M.D.   Dg Knee Complete 4 Views Left  10/31/2012   *RADIOLOGY REPORT*  Clinical Data: Fall  LEFT KNEE - COMPLETE 4+ VIEW  Comparison: None  Findings: Osseous demineralization. Joint spaces preserved. No acute fracture, dislocation, or bone destruction.  IMPRESSION: No acute osseous abnormalities.   Original Report Authenticated By: Ulyses Southward, M.D.   Dg Knee Complete 4 Views Right  10/31/2012   *RADIOLOGY REPORT*  Clinical Data: Fall.  Knee pain.  RIGHT KNEE - COMPLETE 4+ VIEW  Comparison: None.  Findings: Mild medial and lateral compartment osteoarthritis.  Mild patellofemoral osteoarthritis.  There is no  fracture.  The alignment the knee is anatomic.  There is swelling anterior to the patella, extending in the subcutaneous tissues.  This likely represents contusion.  There is no displaced fracture.  IMPRESSION: Mild tricompartmental osteoarthritis.  No acute osseous abnormality.   Original Report Authenticated By: Andreas Newport, M.D.   Dg Hand Complete Left  10/31/2012   *RADIOLOGY REPORT*  Clinical Data: History of fall with injury to the left hand.  Hand pain.  LEFT HAND - COMPLETE 3+ VIEW  Comparison: No priors.  Findings: Four views of the left hand demonstrate no acute displaced fracture or dislocation.  There is multifocal joint space narrowing, subchondral sclerosis, subchondral cyst formation and osteophyte formation, most pronounced in the DIP and PIP joints,  compatible with osteoarthritis.  This is most severe at the first interphalangeal joint where there is also some radial subluxation.  IMPRESSION: 1.  No acute radiographic abnormality of the left hand. 2.  Moderate osteoarthritis, as above.   Original Report Authenticated By: Trudie Reed, M.D.   1. Fall, initial encounter   2. Nasal bone fracture, closed, initial encounter   3. Osteoarthritis, knee   4. Hematoma   5. Abrasion     MDM  Pt with mechanical fall.  Neurovascularly intact throughout.  Pt with abrasion to left elbow, hematoma on hand, ecchymoses of both knees, mildly depressed fracture of nasal bones.  No other injury noted.  Doubt intracranial hemorrhage.  Pt d/c home with percocet (warned about increased risk of fall with pain medication), ENT follow up.  Discussed all results, findings, treatment, follow up with patient.  Pt given return precautions.  Pt verbalizes understanding and agrees with plan.       Trixie Dredge, PA-C 10/31/12 1536

## 2012-10-31 NOTE — ED Provider Notes (Signed)
Medical screening examination/treatment/procedure(s) were performed by non-physician practitioner and as supervising physician I was immediately available for consultation/collaboration.  Diego Ulbricht, MD 10/31/12 1554 

## 2012-10-31 NOTE — ED Notes (Signed)
Pt. Stated, I fell while cleaning out the pool. Abrsions to knee face and nose.  Pt. Larey Seat on concrete

## 2013-02-16 ENCOUNTER — Other Ambulatory Visit: Payer: Self-pay

## 2013-03-21 ENCOUNTER — Other Ambulatory Visit: Payer: Self-pay | Admitting: Orthopedic Surgery

## 2013-03-21 NOTE — Progress Notes (Signed)
Surgery scheduled fro 04/03/13.  Preop on 03/29/13 at 0800am.  Need orders in EPIC.  Thank You.

## 2013-03-21 NOTE — Progress Notes (Signed)
Preoperative surgical orders have been place into the Epic hospital system for Brandi Cummings on 03/21/2013, 2:23 PM  by Patrica Duel for surgery on 04-03-2013.  Preop Knee Scope orders including IV Tylenol and IV Decadron as long as there are no contraindications to the above medications. Avel Peace, PA-C

## 2013-03-28 ENCOUNTER — Encounter (HOSPITAL_COMMUNITY): Payer: Self-pay | Admitting: Pharmacy Technician

## 2013-03-28 ENCOUNTER — Other Ambulatory Visit (HOSPITAL_COMMUNITY): Payer: Self-pay | Admitting: Orthopedic Surgery

## 2013-03-28 NOTE — Patient Instructions (Addendum)
20 ELENIE COVEN  03/28/2013   Your procedure is scheduled on:  04/03/13  MONDAY  Report to [facility] at      1:15PM  Call this number if you have problems the morning of surgery: [phone]       Remember:   Do not eat food After Midnight.SUNDAY NIGHT--- MAY HAVE CLEAR LIQUIDS Monday MORNING UNTIL 1015AM-- THEN NOTHING BY MOUTH   Take these medicines the morning of surgery with A SIP OF WATER: THYROID (ARMOUR), Venalafaxine DO NOT TAKE ANY BLOOD SUGAR MEDICATION MORNING OF SURGERY  .  Contacts, dentures or partial plates can not be worn to surgery  Leave suitcase in the car. After surgery it may be brought to your room.  For patients admitted to the hospital, checkout time is 11:00 AM day of  discharge.             SPECIAL INSTRUCTIONS- SEE [place] PREPARING FOR SURGERY INSTRUCTION SHEET-     DO NOT WEAR JEWELRY, LOTIONS, POWDERS, OR PERFUMES.  WOMEN-- DO NOT SHAVE LEGS OR UNDERARMS FOR 12 HOURS BEFORE SHOWERS. MEN MAY SHAVE FACE.  Patients discharged the day of surgery will not be allowed to drive home. IF going home the day of surgery, you must have a driver and someone to stay with you for the first 24 hours  Name and phone number of your driver:      Hessie Diener  husband                                                                  Please read over the following fact sheets that you were given:Incentive Spirometry Sheet                                                                                  Emelee Rodocker  PST [phone]  [phone]                 FAILURE TO FOLLOW THESE INSTRUCTIONS MAY RESULT IN  CANCELLATION   OF YOUR SURGERY                                                  Patient Signature _____________________________

## 2013-03-29 ENCOUNTER — Encounter (HOSPITAL_COMMUNITY)
Admission: RE | Admit: 2013-03-29 | Discharge: 2013-03-29 | Disposition: A | Discharge status: Home / Self Care | Payer: BC Managed Care – PPO | Source: Ambulatory Visit | Attending: Orthopedic Surgery | Admitting: Orthopedic Surgery

## 2013-03-29 ENCOUNTER — Encounter (HOSPITAL_COMMUNITY): Payer: Self-pay

## 2013-03-29 ENCOUNTER — Ambulatory Visit (HOSPITAL_COMMUNITY)
Admission: RE | Admit: 2013-03-29 | Discharge: 2013-03-29 | Disposition: A | Discharge status: Home / Self Care | Payer: BC Managed Care – PPO | Source: Ambulatory Visit | Attending: Orthopedic Surgery | Admitting: Orthopedic Surgery

## 2013-03-29 DIAGNOSIS — M47814 Spondylosis without myelopathy or radiculopathy, thoracic region: Secondary | ICD-10-CM | POA: Insufficient documentation

## 2013-03-29 DIAGNOSIS — E119 Type 2 diabetes mellitus without complications: Secondary | ICD-10-CM | POA: Insufficient documentation

## 2013-03-29 DIAGNOSIS — Z01818 Encounter for other preprocedural examination: Secondary | ICD-10-CM | POA: Insufficient documentation

## 2013-03-29 DIAGNOSIS — M4130 Thoracogenic scoliosis, site unspecified: Secondary | ICD-10-CM | POA: Insufficient documentation

## 2013-03-29 DIAGNOSIS — Z01812 Encounter for preprocedural laboratory examination: Secondary | ICD-10-CM | POA: Insufficient documentation

## 2013-03-29 DIAGNOSIS — Z01810 Encounter for preprocedural cardiovascular examination: Secondary | ICD-10-CM | POA: Insufficient documentation

## 2013-03-29 HISTORY — DX: Infectious mononucleosis, unspecified without complication: B27.90

## 2013-03-29 HISTORY — DX: Nonrheumatic mitral (valve) prolapse: I34.1

## 2013-03-29 HISTORY — DX: Major depressive disorder, single episode, unspecified: F32.9

## 2013-03-29 HISTORY — DX: Depression, unspecified: F32.A

## 2013-03-29 LAB — CBC
Hemoglobin: 14 g/dL (ref 12.0–15.0)
Platelets: 326 10*3/uL (ref 150–400)
RBC: 4.43 MIL/uL (ref 3.87–5.11)
RDW: 12.9 % (ref 11.5–15.5)
WBC: 5.6 10*3/uL (ref 4.0–10.5)

## 2013-03-29 LAB — BASIC METABOLIC PANEL
Calcium: 10 mg/dL (ref 8.4–10.5)
Creatinine, Ser: 0.66 mg/dL (ref 0.50–1.10)
GFR calc Af Amer: >90 mL/min (ref >90)
Glucose, Bld: 102 mg/dL — ABNORMAL HIGH (ref 70–99)

## 2013-04-02 DIAGNOSIS — S83249A Other tear of medial meniscus, current injury, unspecified knee, initial encounter: Secondary | ICD-10-CM

## 2013-04-02 NOTE — H&P (Signed)
  CC- LATINA Brandi Cummings is a 57 y.o. female who presents with left knee pain.  HPI- . Knee Pain: Patient presents with knee pain involving the  left knee. Onset of the symptoms was several months ago. Inciting event: a fall. Current symptoms include giving out, pain located medially and stiffness. Pain is aggravated by kneeling, rising after sitting and standing.  Patient has had no prior knee problems. Evaluation to date: MRI: abnormal medial and lateral meniscal tears. Treatment to date: rest.  Past Medical History  Diagnosis Date  . Cancer   . Diabetes mellitus   . Thyroid disease   . Hyperlipidemia   . MVP (mitral valve prolapse) 1990    was told  yrs ago- eccho 2010  in [place]  . Mononucleosis     as a teen  . Depression     Past Surgical History  Procedure Laterality Date  . Abdominal hysterectomy    . Arm fracture    . Breast surgery    . Mastectomy Bilateral     with reconstruction    Prior to Admission medications   Medication Sig Start Date End Date Taking? Authorizing Provider  cetirizine (ZYRTEC) 10 MG tablet Take 10 mg by mouth at bedtime.     Historical Provider, MD  doxycycline (ORACEA) 40 MG capsule Take 40 mg by mouth daily.    Historical Provider, MD  metFORMIN (GLUCOPHAGE-XR) 500 MG 24 hr tablet Take 500 mg by mouth at bedtime.  08/16/11   Historical Provider, MD  naproxen sodium (ANAPROX) 220 MG tablet Take 220 mg by mouth 2 (two) times daily with a meal.    Historical Provider, MD  thyroid (ARMOUR) 60 MG tablet Take 60 mg by mouth daily before breakfast.    Historical Provider, MD  venlafaxine XR (EFFEXOR-XR) 37.5 MG 24 hr capsule Take 37.5 mg by mouth daily with breakfast.    Historical Provider, MD   KNEE EXAM antalgic gait, soft tissue tenderness over medial joint line,no effusion, negative pivot-shift, collateral ligaments intact  Physical Examination: General appearance - alert, well appearing, and in no distress Mental status - alert, oriented to person,  place, and time Chest - clear to auscultation, no wheezes, rales or rhonchi, symmetric air entry Heart - normal rate, regular rhythm, normal S1, S2, no murmurs, rubs, clicks or gallops Abdomen - soft, nontender, nondistended, no masses or organomegaly Neurological - alert, oriented, normal speech, no focal findings or movement disorder noted   Asessment/Plan--- Left knee medial meniscal tear- - Plan left knee arthroscopy with meniscal debridement. Procedure risks and potential comps discussed with patient who elects to proceed. Goals are decreased pain and increased function with a high likelihood of achieving both

## 2013-04-03 ENCOUNTER — Encounter (HOSPITAL_COMMUNITY): Payer: Self-pay | Admitting: General Practice

## 2013-04-03 ENCOUNTER — Ambulatory Visit (HOSPITAL_COMMUNITY): Payer: BC Managed Care – PPO | Admitting: Anesthesiology

## 2013-04-03 ENCOUNTER — Encounter (HOSPITAL_COMMUNITY)
Admission: RE | Disposition: A | Discharge status: Home / Self Care | Payer: Self-pay | Source: Ambulatory Visit | Attending: Orthopedic Surgery

## 2013-04-03 ENCOUNTER — Encounter (HOSPITAL_COMMUNITY): Payer: BC Managed Care – PPO | Admitting: Anesthesiology

## 2013-04-03 ENCOUNTER — Ambulatory Visit (HOSPITAL_COMMUNITY)
Admission: RE | Admit: 2013-04-03 | Discharge: 2013-04-03 | Disposition: A | Discharge status: Home / Self Care | Payer: BC Managed Care – PPO | Source: Ambulatory Visit | Attending: Orthopedic Surgery | Admitting: Orthopedic Surgery

## 2013-04-03 DIAGNOSIS — M23302 Other meniscus derangements, unspecified lateral meniscus, unspecified knee: Secondary | ICD-10-CM | POA: Insufficient documentation

## 2013-04-03 DIAGNOSIS — Z79899 Other long term (current) drug therapy: Secondary | ICD-10-CM | POA: Insufficient documentation

## 2013-04-03 DIAGNOSIS — S83242A Other tear of medial meniscus, current injury, left knee, initial encounter: Secondary | ICD-10-CM

## 2013-04-03 DIAGNOSIS — M23329 Other meniscus derangements, posterior horn of medial meniscus, unspecified knee: Secondary | ICD-10-CM | POA: Insufficient documentation

## 2013-04-03 DIAGNOSIS — E785 Hyperlipidemia, unspecified: Secondary | ICD-10-CM | POA: Insufficient documentation

## 2013-04-03 DIAGNOSIS — E079 Disorder of thyroid, unspecified: Secondary | ICD-10-CM | POA: Insufficient documentation

## 2013-04-03 DIAGNOSIS — E119 Type 2 diabetes mellitus without complications: Secondary | ICD-10-CM | POA: Insufficient documentation

## 2013-04-03 DIAGNOSIS — M2240 Chondromalacia patellae, unspecified knee: Secondary | ICD-10-CM | POA: Insufficient documentation

## 2013-04-03 DIAGNOSIS — S83249A Other tear of medial meniscus, current injury, unspecified knee, initial encounter: Secondary | ICD-10-CM

## 2013-04-03 HISTORY — PX: KNEE ARTHROSCOPY: SHX127

## 2013-04-03 LAB — GLUCOSE, CAPILLARY

## 2013-04-03 SURGERY — ARTHROSCOPY, KNEE
Anesthesia: General | Site: Knee | Laterality: Left

## 2013-04-03 MED ORDER — GLYCOPYRROLATE 0.2 MG/ML IJ SOLN
INTRAMUSCULAR | Status: DC | PRN
  Administered 2013-04-03: 0.2 mg via INTRAVENOUS

## 2013-04-03 MED ORDER — FENTANYL CITRATE 0.05 MG/ML IJ SOLN
INTRAMUSCULAR | Status: AC
  Filled 2013-04-03: qty 5

## 2013-04-03 MED ORDER — ONDANSETRON HCL 4 MG/2ML IJ SOLN
INTRAMUSCULAR | Status: DC | PRN
  Administered 2013-04-03: 4 mg via INTRAVENOUS

## 2013-04-03 MED ORDER — HYDROCODONE-ACETAMINOPHEN 5-325 MG PO TABS: 1.0000 | ORAL_TABLET | Freq: Four times a day (QID) | ORAL | Status: DC | PRN

## 2013-04-03 MED ORDER — VANCOMYCIN HCL IN DEXTROSE 1-5 GM/200ML-% IV SOLN: 1000.0000 mg | INTRAVENOUS | Status: DC

## 2013-04-03 MED ORDER — BUPIVACAINE-EPINEPHRINE 0.25% -1:200000 IJ SOLN
INTRAMUSCULAR | Status: DC | PRN
  Administered 2013-04-03: 20 mL

## 2013-04-03 MED ORDER — LIDOCAINE HCL (CARDIAC) 20 MG/ML IV SOLN
INTRAVENOUS | Status: AC
  Filled 2013-04-03: qty 5

## 2013-04-03 MED ORDER — HYDROMORPHONE HCL PF 1 MG/ML IJ SOLN
INTRAMUSCULAR | Status: DC | PRN
  Administered 2013-04-03 (×2): 1 mg via INTRAVENOUS

## 2013-04-03 MED ORDER — MIDAZOLAM HCL 5 MG/5ML IJ SOLN
INTRAMUSCULAR | Status: DC | PRN
  Administered 2013-04-03: 2 mg via INTRAVENOUS

## 2013-04-03 MED ORDER — DEXAMETHASONE SODIUM PHOSPHATE 10 MG/ML IJ SOLN
10.0000 mg | Freq: Once | INTRAMUSCULAR | Status: AC
  Administered 2013-04-03: 10 mg via INTRAVENOUS

## 2013-04-03 MED ORDER — VANCOMYCIN HCL IN DEXTROSE 1-5 GM/200ML-% IV SOLN
1000.0000 mg | INTRAVENOUS | Status: AC
  Administered 2013-04-03: 1000 mg via INTRAVENOUS

## 2013-04-03 MED ORDER — PROMETHAZINE HCL 25 MG/ML IJ SOLN: 6.2500 mg | INTRAMUSCULAR | Status: DC | PRN

## 2013-04-03 MED ORDER — ACETAMINOPHEN 10 MG/ML IV SOLN
1000.0000 mg | Freq: Once | INTRAVENOUS | Status: AC
  Administered 2013-04-03: 1000 mg via INTRAVENOUS
  Filled 2013-04-03 (×2): qty 100

## 2013-04-03 MED ORDER — SODIUM CHLORIDE 0.9 % IV SOLN: INTRAVENOUS | Status: DC

## 2013-04-03 MED ORDER — METHOCARBAMOL 500 MG PO TABS: 500.0000 mg | ORAL_TABLET | Freq: Four times a day (QID) | ORAL | Status: DC

## 2013-04-03 MED ORDER — MIDAZOLAM HCL 2 MG/2ML IJ SOLN
INTRAMUSCULAR | Status: AC
  Filled 2013-04-03: qty 2

## 2013-04-03 MED ORDER — LACTATED RINGERS IR SOLN
Status: DC | PRN
  Administered 2013-04-03: 16000 mL

## 2013-04-03 MED ORDER — DIPHENHYDRAMINE HCL 50 MG/ML IJ SOLN
INTRAMUSCULAR | Status: AC
  Filled 2013-04-03: qty 1

## 2013-04-03 MED ORDER — DIPHENHYDRAMINE HCL 50 MG/ML IJ SOLN
INTRAMUSCULAR | Status: DC | PRN
  Administered 2013-04-03: 12.5 mg via INTRAVENOUS

## 2013-04-03 MED ORDER — LIDOCAINE HCL (CARDIAC) 20 MG/ML IV SOLN
INTRAVENOUS | Status: DC | PRN
  Administered 2013-04-03: 100 mg via INTRAVENOUS

## 2013-04-03 MED ORDER — PROPOFOL 10 MG/ML IV BOLUS
INTRAVENOUS | Status: DC | PRN
  Administered 2013-04-03: 200 mg via INTRAVENOUS

## 2013-04-03 MED ORDER — SUCCINYLCHOLINE CHLORIDE 20 MG/ML IJ SOLN
INTRAMUSCULAR | Status: AC
  Filled 2013-04-03: qty 1

## 2013-04-03 MED ORDER — HYDROMORPHONE HCL PF 2 MG/ML IJ SOLN
INTRAMUSCULAR | Status: AC
  Filled 2013-04-03: qty 1

## 2013-04-03 MED ORDER — BUPIVACAINE-EPINEPHRINE 0.25% -1:200000 IJ SOLN
INTRAMUSCULAR | Status: AC
  Filled 2013-04-03: qty 1

## 2013-04-03 MED ORDER — PROPOFOL 10 MG/ML IV BOLUS
INTRAVENOUS | Status: AC
  Filled 2013-04-03: qty 20

## 2013-04-03 MED ORDER — GLYCOPYRROLATE 0.2 MG/ML IJ SOLN
INTRAMUSCULAR | Status: AC
  Filled 2013-04-03: qty 1

## 2013-04-03 MED ORDER — DEXAMETHASONE SODIUM PHOSPHATE 10 MG/ML IJ SOLN
INTRAMUSCULAR | Status: AC
  Filled 2013-04-03: qty 1

## 2013-04-03 MED ORDER — ONDANSETRON HCL 4 MG/2ML IJ SOLN
INTRAMUSCULAR | Status: AC
  Filled 2013-04-03: qty 2

## 2013-04-03 MED ORDER — LACTATED RINGERS IV SOLN
INTRAVENOUS | Status: DC | PRN
  Administered 2013-04-03: 15:00:00 via INTRAVENOUS

## 2013-04-03 MED ORDER — VANCOMYCIN HCL IN DEXTROSE 1-5 GM/200ML-% IV SOLN
INTRAVENOUS | Status: AC
  Filled 2013-04-03: qty 200

## 2013-04-03 MED ORDER — HYDROCODONE-ACETAMINOPHEN 5-325 MG PO TABS
1.0000 | ORAL_TABLET | Freq: Four times a day (QID) | ORAL | Status: DC | PRN
  Administered 2013-04-03: 1 via ORAL
  Filled 2013-04-03: qty 1

## 2013-04-03 MED ORDER — FENTANYL CITRATE 0.05 MG/ML IJ SOLN
INTRAMUSCULAR | Status: AC
  Filled 2013-04-03: qty 2

## 2013-04-03 MED ORDER — FENTANYL CITRATE 0.05 MG/ML IJ SOLN
INTRAMUSCULAR | Status: DC | PRN
  Administered 2013-04-03 (×4): 50 ug via INTRAVENOUS

## 2013-04-03 MED ORDER — FENTANYL CITRATE 0.05 MG/ML IJ SOLN
25.0000 ug | INTRAMUSCULAR | Status: DC | PRN
  Administered 2013-04-03 (×2): 50 ug via INTRAVENOUS

## 2013-04-03 SURGICAL SUPPLY — 27 items
BANDAGE ELASTIC 6 VELCRO ST LF (GAUZE/BANDAGES/DRESSINGS) ×2 IMPLANT
BLADE 4.2CUDA (BLADE) ×2 IMPLANT
CLOTH BEACON ORANGE TIMEOUT ST (SAFETY) ×2 IMPLANT
COUNTER NEEDLE 20 DBL MAG RED (NEEDLE) ×2 IMPLANT
CUFF TOURN SGL QUICK 34 (TOURNIQUET CUFF) ×2
CUFF TRNQT CYL 34X4X40X1 (TOURNIQUET CUFF) ×1 IMPLANT
DRAPE U-SHAPE 47X51 STRL (DRAPES) ×2 IMPLANT
DRSG ADAPTIC 3X8 NADH LF (GAUZE/BANDAGES/DRESSINGS) ×2 IMPLANT
DRSG EMULSION OIL 3X3 NADH (GAUZE/BANDAGES/DRESSINGS) ×2 IMPLANT
DRSG PAD ABDOMINAL 8X10 ST (GAUZE/BANDAGES/DRESSINGS) ×2 IMPLANT
DURAPREP 26ML APPLICATOR (WOUND CARE) ×2 IMPLANT
GLOVE BIO SURGEON STRL SZ8 (GLOVE) ×2 IMPLANT
GLOVE BIOGEL PI IND STRL 8 (GLOVE) ×1 IMPLANT
GLOVE BIOGEL PI INDICATOR 8 (GLOVE) ×1
GOWN PREVENTION PLUS LG XLONG (DISPOSABLE) ×2 IMPLANT
MANIFOLD NEPTUNE II (INSTRUMENTS) ×2 IMPLANT
PACK ARTHROSCOPY WL (CUSTOM PROCEDURE TRAY) ×2 IMPLANT
PACK ICE MAXI GEL EZY WRAP (MISCELLANEOUS) ×6 IMPLANT
PADDING CAST COTTON 6X4 STRL (CAST SUPPLIES) ×2 IMPLANT
POSITIONER SURGICAL ARM (MISCELLANEOUS) ×2 IMPLANT
SET ARTHROSCOPY TUBING (MISCELLANEOUS) ×2
SET ARTHROSCOPY TUBING LN (MISCELLANEOUS) ×1 IMPLANT
SPONGE GAUZE 4X4 12PLY (GAUZE/BANDAGES/DRESSINGS) ×2 IMPLANT
SUT ETHILON 4 0 PS 2 18 (SUTURE) ×2 IMPLANT
TOWEL OR 17X26 10 PK STRL BLUE (TOWEL DISPOSABLE) ×2 IMPLANT
WAND 90 DEG TURBOVAC W/CORD (SURGICAL WAND) ×1 IMPLANT
WRAP KNEE MAXI GEL POST OP (GAUZE/BANDAGES/DRESSINGS) ×2 IMPLANT

## 2013-04-03 NOTE — Interval H&P Note (Signed)
History and Physical Interval Note:  04/03/2013 3:58 PM  Brandi Cummings  has presented today for surgery, with the diagnosis of left knee medial mensical tear  The various methods of treatment have been discussed with the patient and family. After consideration of risks, benefits and other options for treatment, the patient has consented to  Procedure(s): LEFT KNEE ARTHROSCOPY WITH DEBRIDEMENT (Left) as a surgical intervention .  The patient's history has been reviewed, patient examined, no change in status, stable for surgery.  I have reviewed the patient's chart and labs.  Questions were answered to the patient's satisfaction.     [name]

## 2013-04-03 NOTE — Op Note (Signed)
Preoperative diagnosis-  Left knee medial and lateral meniscal tears  Postoperative diagnosis Left- knee medial and lateral meniscal tears plus medial femoral chondral defect   Procedure- Right knee arthroscopy with medial and lateral meniscal debridement and chondroplasty   Surgeon- Gus Rankin. Zaakirah Kistner, MD  Anesthesia-General  EBL-  minimal Complications- None  Condition- PACU - hemodynamically stable.  Brief clinical note- -Brandi Cummings is a 57 y.o.  female with a several month history of left knee pain and mechanical symptoms. Exam and history suggested medial meniscal tear confirmed by MRI. The patient presents now for arthroscopy and debridement   Procedure in detail -       After successful administration of General anesthetic, a tourmiquet is placed high on the Left  thigh and the Left lower extremity is prepped and draped in the usual sterile fashion. Time out is performed by the surgical team. Standard superomedial and inferolateral portal sites are marked and incisions made with an 11 blade. The inflow cannula is passed through the superomedial portal and camera through the inferolateral portal and inflow is initiated. Arthroscopic visualization proceeds.      The undersurface of the patella and trochlea are visualized and there is Grade II chondromalacia patella but no unstable defects. The medial and lateral gutters are visualized and there are  no loose bodies. Flexion and valgus force is applied to the knee and the medial compartment is entered. A spinal needle is passed into the joint through the site marked for the inferomedial portal. A small incision is made and the dilator passed into the joint. The findings for the medial compartment are degenerative unstable tear body and posterior horn medial meniscus and Grade III unstable chondral lesion 1 x 2 cm medial femoral condyle . The tear is debrided to a stable base with baskets and a shaver and sealed off with the Arthrocare.  The shaver is used to debride the unstable cartilage to a stable cartilaginous base with stable edges. It is probed and found to be stable.    The intercondylar notch is visualized and the ACL appears normal. The lateral compartment is entered and the findings are small tear body of lateral meniscus with normal appearing chondral surfaces . The tear is debrided to a stable base with baskets and a shaver and sealed off with the Arthrocare.  It is probed and found to be stable.     The joint is again inspected and there are no other tears, defects or loose bodies identified. The arthroscopic equipment is then removed from the inferior portals which are closed with interrupted 4-0 nylon. 20 ml of .25% Marcaine with epinephrine are injected through the inflow cannula and the cannula is then removed and the portal closed with nylon. The incisions are cleaned and dried and a bulky sterile dressing is applied. The patient is then awakened and transported to recovery in stable condition.   04/03/2013, 5:51 PM

## 2013-04-03 NOTE — Anesthesia Postprocedure Evaluation (Signed)
  Anesthesia Post-op Note  Patient: Brandi Cummings  Procedure(s) Performed: Procedure(s) (LRB): LEFT KNEE ARTHROSCOPY MENISCAL DEBRIDEMENT WITH CHONDROPLASTY (Left)  Patient Location: PACU  Anesthesia Type: General  Level of Consciousness: awake and alert   Airway and Oxygen Therapy: Patient Spontanous Breathing  Post-op Pain: mild  Post-op Assessment: Post-op Vital signs reviewed, Patient's Cardiovascular Status Stable, Respiratory Function Stable, Patent Airway and No signs of Nausea or vomiting  Last Vitals:  Filed Vitals:   04/03/13 2001  BP: 161/85  Pulse: 89  Temp: 36.8 C  Resp: 20    Post-op Vital Signs: stable   Complications: No apparent anesthesia complications

## 2013-04-03 NOTE — Transfer of Care (Signed)
Immediate Anesthesia Transfer of Care Note  Patient: Brandi Cummings  Procedure(s) Performed: Procedure(s) (LRB): LEFT KNEE ARTHROSCOPY MENISCAL DEBRIDEMENT WITH CHONDROPLASTY (Left)  Patient Location: PACU  Anesthesia Type: General  Level of Consciousness: sedated, patient cooperative and responds to stimulation  Airway & Oxygen Therapy: Patient Spontanous Breathing and Patient connected to face mask oxgen  Post-op Assessment: Report given to PACU RN and Post -op Vital signs reviewed and stable  Post vital signs: Reviewed and stable  Complications: No apparent anesthesia complications

## 2013-04-03 NOTE — Preoperative (Signed)
Beta Blockers   Reason not to administer Beta Blockers:Not Applicable 

## 2013-04-03 NOTE — Anesthesia Preprocedure Evaluation (Signed)
Anesthesia Evaluation  Patient identified by MRN, date of birth, ID band Patient awake    Reviewed: Allergy & Precautions, H&P , NPO status , Patient's Chart, lab work & pertinent test results  Airway Mallampati: II TM Distance: >3 FB Neck ROM: Full    Dental no notable dental hx.    Pulmonary neg pulmonary ROS,  breath sounds clear to auscultation  Pulmonary exam normal       Cardiovascular Exercise Tolerance: Good Rhythm:Regular Rate:Normal     Neuro/Psych PSYCHIATRIC DISORDERS Depression negative neurological ROS     GI/Hepatic negative GI ROS, Neg liver ROS,   Endo/Other  diabetes, Type 2, Oral Hypoglycemic Agents  Renal/GU negative Renal ROS  negative genitourinary   Musculoskeletal negative musculoskeletal ROS (+)   Abdominal   Peds negative pediatric ROS (+)  Hematology negative hematology ROS (+)   Anesthesia Other Findings   Reproductive/Obstetrics negative OB ROS                           Anesthesia Physical Anesthesia Plan  ASA: II  Anesthesia Plan: General   Post-op Pain Management:    Induction: Intravenous  Airway Management Planned: LMA  Additional Equipment:   Intra-op Plan:   Post-operative Plan: Extubation in OR  Informed Consent: I have reviewed the patients History and Physical, chart, labs and discussed the procedure including the risks, benefits and alternatives for the proposed anesthesia with the patient or authorized representative who has indicated his/her understanding and acceptance.   Dental advisory given  Plan Discussed with: CRNA  Anesthesia Plan Comments:         Anesthesia Quick Evaluation

## 2013-04-04 ENCOUNTER — Encounter (HOSPITAL_COMMUNITY): Payer: Self-pay | Admitting: Orthopedic Surgery

## 2013-08-10 ENCOUNTER — Telehealth: Payer: Self-pay | Admitting: Oncology

## 2013-08-10 NOTE — Telephone Encounter (Signed)
Pt called and wants appt moved to july and pt does not want to see Covering provider 2 , r/s appt to Dr Jana Hakim per pt rqst

## 2013-08-17 ENCOUNTER — Other Ambulatory Visit: Payer: BC Managed Care – PPO

## 2013-08-24 ENCOUNTER — Ambulatory Visit: Payer: BC Managed Care – PPO | Admitting: Oncology

## 2013-09-07 ENCOUNTER — Other Ambulatory Visit: Payer: BC Managed Care – PPO

## 2013-09-10 ENCOUNTER — Other Ambulatory Visit: Payer: Self-pay | Admitting: Oncology

## 2013-09-10 DIAGNOSIS — C50919 Malignant neoplasm of unspecified site of unspecified female breast: Secondary | ICD-10-CM

## 2013-09-11 ENCOUNTER — Ambulatory Visit: Payer: BC Managed Care – PPO

## 2013-09-14 ENCOUNTER — Other Ambulatory Visit: Payer: Self-pay | Admitting: Dermatology

## 2013-10-18 ENCOUNTER — Other Ambulatory Visit: Payer: Self-pay | Admitting: *Deleted

## 2013-10-18 DIAGNOSIS — C50912 Malignant neoplasm of unspecified site of left female breast: Secondary | ICD-10-CM

## 2013-10-19 ENCOUNTER — Other Ambulatory Visit (HOSPITAL_BASED_OUTPATIENT_CLINIC_OR_DEPARTMENT_OTHER): Payer: 59

## 2013-10-19 DIAGNOSIS — C50919 Malignant neoplasm of unspecified site of unspecified female breast: Secondary | ICD-10-CM

## 2013-10-19 DIAGNOSIS — C50912 Malignant neoplasm of unspecified site of left female breast: Secondary | ICD-10-CM

## 2013-10-19 LAB — COMPREHENSIVE METABOLIC PANEL (CC13)
ALBUMIN: 4.1 g/dL (ref 3.5–5.0)
ALK PHOS: 82 U/L (ref 40–150)
ALT: 21 U/L (ref 0–55)
AST: 24 U/L (ref 5–34)
Anion Gap: 10 mEq/L (ref 3–11)
BUN: 16.1 mg/dL (ref 7.0–26.0)
CO2: 25 mEq/L (ref 22–29)
Calcium: 9.6 mg/dL (ref 8.4–10.4)
Chloride: 108 mEq/L (ref 98–109)
Creatinine: 0.7 mg/dL (ref 0.6–1.1)
Glucose: 120 mg/dl (ref 70–140)
POTASSIUM: 4.1 meq/L (ref 3.5–5.1)
SODIUM: 142 meq/L (ref 136–145)
TOTAL PROTEIN: 6.8 g/dL (ref 6.4–8.3)
Total Bilirubin: 0.94 mg/dL (ref 0.20–1.20)

## 2013-10-19 LAB — CBC WITH DIFFERENTIAL/PLATELET
BASO%: 0.8 % (ref 0.0–2.0)
Basophils Absolute: 0 10*3/uL (ref 0.0–0.1)
EOS%: 3.3 % (ref 0.0–7.0)
Eosinophils Absolute: 0.2 10*3/uL (ref 0.0–0.5)
HCT: 39.9 % (ref 34.8–46.6)
HGB: 13.3 g/dL (ref 11.6–15.9)
LYMPH%: 39.7 % (ref 14.0–49.7)
MCH: 31.3 pg (ref 25.1–34.0)
MCHC: 33.3 g/dL (ref 31.5–36.0)
MCV: 94 fL (ref 79.5–101.0)
MONO#: 0.5 10*3/uL (ref 0.1–0.9)
MONO%: 10 % (ref 0.0–14.0)
NEUT#: 2.3 10*3/uL (ref 1.5–6.5)
NEUT%: 46.2 % (ref 38.4–76.8)
Platelets: 322 10*3/uL (ref 145–400)
RBC: 4.24 10*6/uL (ref 3.70–5.45)
RDW: 13.2 % (ref 11.2–14.5)
WBC: 5 10*3/uL (ref 3.9–10.3)
lymph#: 2 10*3/uL (ref 0.9–3.3)

## 2013-10-25 ENCOUNTER — Ambulatory Visit (HOSPITAL_BASED_OUTPATIENT_CLINIC_OR_DEPARTMENT_OTHER): Payer: 59 | Admitting: Oncology

## 2013-10-25 ENCOUNTER — Other Ambulatory Visit: Payer: BC Managed Care – PPO

## 2013-10-25 VITALS — BP 123/62 | HR 84 | Temp 98.1°F | Resp 18 | Ht 70.0 in | Wt 199.5 lb

## 2013-10-25 DIAGNOSIS — C50919 Malignant neoplasm of unspecified site of unspecified female breast: Secondary | ICD-10-CM

## 2013-10-25 DIAGNOSIS — Z853 Personal history of malignant neoplasm of breast: Secondary | ICD-10-CM

## 2013-10-25 MED ORDER — VENLAFAXINE HCL ER 37.5 MG PO CP24: 37.5000 mg | ORAL_CAPSULE | Freq: Every day | ORAL | Status: AC

## 2013-10-25 NOTE — Progress Notes (Signed)
ID: Brandi Cummings   DOB: July 17, 1955  MR#: [id number]  CSN#:[id number]  PCP: Vidal Schwalbe, MD GYN: SU: OTHER MD: Jacelyn Pi, Crista Luria  HISTORY OF PRESENT ILLNESS: She noted a lump in her left breast in the fall of 2009 and had mammography at [facility] with [name] on February 17, 2008.  He found the breast to be heterogeneously dense with no interval change by mammography.  Ultrasound showed an oval, well circumscribed hypoechoic mass measuring 1 cm in the 10'clock position, 2 cm from the nipple in the left breast corresponding to the palpable mass.  This was felt to be benign appearing fibrocystic change and a six-month mammogram was suggested.  In January of 2010 she saw Dr. [name] who palpated the mass and he referred her to Dr. [name] for further evaluation.  Dr. Excell Seltzer reviewed the data and offered the patient further observation as already discussed versus excision and the patient chose the latter.  Her excisional biopsy was performed May 28, 2008 and showed an invasive ductal carcinoma, grade 3, measuring 7 mm, with good margins for the invasive component, but what looked like positive margins in the in situ component.  No lymphovascular invasion and a prognostic panel which showed the tumor to be ER and PR negative with a proliferation marker of 34% and HER2/neu negative by CISH with a ratio of 1.21 (PM10-121 and [id number]).  With this information, the patient proceeded to bilateral breast MRIs on March 4 with this showed only a seroma in the upper inner quadrant of the left breast measuring 4.9 cm, but otherwise no significant findings on either side and no evidence of internal mammary or axillary lymph node enhancement.  On June 21, 2008 the patient underwent reexcision together with left axillary lymph node biopsy ([id number]) a negative sentinel lymph node with the inferior margin still showing a microscopic focus of high-grade ductal carcinoma with the deep margins still  showing focal involvement by ductal carcinoma in situ.  All the other margins were negative.   Her subsequent history is as detailed below.  INTERVAL HISTORY: Brandi Cummings returns today for followup of her triple negative breast cancer. The interval history is generally unremarkable. She is exercising chiefly by swimming, although she is not using the elliptical at home as much as she put it is supposed to". Family is doing fine. She is ready to "graduate" today.  REVIEW OF SYSTEMS: She i has a mild intermittent discomfort over the left clavicle area. She wonders if this is related to exercise. At any rate this is not constant. She also has shooting pains sometimes in the left breast. She was reassured that these shooting or stinging or stabbing pains are very common and they can persist for many years postop. They can also resolve and recur. They're not related to breast cancer per se, only to its treatment. Psychologically she is doing fine and likes the idea of continuing on low-dose venlafaxine. Otherwise a detailed review of systems today was noncontributory  PAST MEDICAL HISTORY: Significant for hysterectomy with bilateral salpingo-oophorectomy in 2006 "I kept my cervix."  She has a history of mitral valve prolapse not requiring antibiotics before dental procedures.  She has a history of hypothyroidism, history of migraines, history of mild osteoarthritis particularly involving the hands.  She has a history of in vitro fertilization remotely. New diagnosis of diabetes  FAMILY HISTORY The patient's father died at the age of 9 in the setting of prostate cancer.  The patient's mother is alive  in her 78s.  The patient is one of four siblings.  There is no history of breast or ovarian cancer in the family.  GYNECOLOGIC HISTORY: She is Gx, P2 first pregnancy at age 87.  She was still menstruating in 2006.  She did not take hormones after the hysterectomy and bilateral salpingo-oophorectomy.    SOCIAL  HISTORY: She is a Agricultural engineer.  Her husband Zenia Resides works in Engineer, technical sales mostly in [place] and does a lot of traveling.  Both her sons are currently in college.  The patient attends a [employer].   ADVANCED DIRECTIVES: in place  HEALTH MAINTENANCE: History  Substance Use Topics  . Smoking status: Never Smoker   . Smokeless tobacco: Never Used  . Alcohol Use: No     Allergies  Allergen Reactions  . Penicillins   . Lactose Intolerance (Gi)     Does not use dairy without using lactaide  . Shrimp [Shellfish Allergy] Hives    Current Outpatient Prescriptions  Medication Sig Dispense Refill  . cetirizine (ZYRTEC) 10 MG tablet Take 10 mg by mouth at bedtime.       Marland Kitchen doxycycline (ORACEA) 40 MG capsule Take 40 mg by mouth daily.      Marland Kitchen HYDROcodone-acetaminophen (NORCO) 5-325 MG per tablet Take 1-2 tablets by mouth every 6 (six) hours as needed for moderate pain.  50 tablet  0  . metFORMIN (GLUCOPHAGE-XR) 500 MG 24 hr tablet Take 500 mg by mouth at bedtime.       . methocarbamol (ROBAXIN) 500 MG tablet Take 1 tablet (500 mg total) by mouth 4 (four) times daily. As needed for muscle spasm  30 tablet  1  . naproxen sodium (ANAPROX) 220 MG tablet Take 220 mg by mouth 2 (two) times daily with a meal.      . thyroid (ARMOUR) 60 MG tablet Take 60 mg by mouth daily before breakfast.      . venlafaxine XR (EFFEXOR-XR) 37.5 MG 24 hr capsule TAKE 1 CAPSULE BY MOUTH EVERY DAY  30 capsule  1   No current facility-administered medications for this visit.    OBJECTIVE: Middle-aged white woman in no acute distress Filed Vitals:   10/25/13 0915  BP: 123/62  Pulse: 84  Temp: 98.1 F (36.7 C)  Resp: 18     Body mass index is 28.63 kg/(m^2).    ECOG FS: 0  Sclerae unicteric, pupils round and reactive Oropharynx clear and moist No cervical or supraclavicular adenopathy Lungs no rales or rhonchi Heart regular rate and rhythm Abd soft, nontender, positive bowel sounds MSK no focal spinal  tenderness, no peripheral edema Neuro: nonfocal, well oriented, pleasant affect Breasts: She is status post bilateral mastectomies with bilateral flap reconstruction. The cosmetic result is excellent. There is a little bit of redundant fatty tissue in the left axilla which causes her problems when shaving. There is no evidence of local recurrence over either breast or over the axilla bilateral  LAB RESULTS: Lab Results  Component Value Date   WBC 5.0 10/19/2013   NEUTROABS 2.3 10/19/2013   HGB 13.3 10/19/2013   HCT 39.9 10/19/2013   MCV 94.0 10/19/2013   PLT 322 10/19/2013      Chemistry      Component Value Date/Time   NA 142 10/19/2013 0827   NA 138 03/29/2013 0900   K 4.1 10/19/2013 0827   K 4.6 03/29/2013 0900   CL 102 03/29/2013 0900   CL 106 08/17/2012 0834   CO2 25 10/19/2013 0827  CO2 27 03/29/2013 0900   BUN 16.1 10/19/2013 0827   BUN 17 03/29/2013 0900   CREATININE 0.7 10/19/2013 0827   CREATININE 0.66 03/29/2013 0900      Component Value Date/Time   CALCIUM 9.6 10/19/2013 0827   CALCIUM 10.0 03/29/2013 0900   ALKPHOS 82 10/19/2013 0827   ALKPHOS 89 08/18/2011 0909   AST 24 10/19/2013 0827   AST 24 08/18/2011 0909   ALT 21 10/19/2013 0827   ALT 19 08/18/2011 0909   BILITOT 0.94 10/19/2013 0827   BILITOT 1.0 08/18/2011 0909       Lab Results  Component Value Date   LABCA2 32 08/18/2011    No components found with this basename: LABCA125    No results found for this basename: INR,  in the last 168 hours  Urinalysis No results found for this basename: colorurine,  appearanceur,  labspec,  phurine,  glucoseu,  hgbur,  bilirubinur,  ketonesur,  proteinur,  urobilinogen,  nitrite,  leukocytesur    STUDIES: No results found.  ASSESSMENT: 58 y.o. [place] woman status post left breast biopsy March 2010 for a triple negative invasive ductal carcinoma, T1bN0 (stage IA) , grade 3, treated adjuvantly with Cytoxan and Taxotere x4, completed in June 2010, followed by bilateral mastectomies and  bilateral gluteal artery perforator flap reconstruction.   PLAN: Belma has completed 5 years of followup and I am comfortable releasing her to her primary care physician at this point. She understands we keep her records for an additional 10 years, so if in that. She has any questions or concerns she can call or if she wishes we will make an appointment for me to reevaluate her by physical exam labs and so on. However I think he would be overkill to see her routinely from this point. She is very comfortable with "graduating". Accordingly no further routine appointments are being made for her here.  As far as breast cancer screening is concerned all she will need in the future it is a physician breast exam once a year.  I have refilled her venlafaxine for the next 3 months. That is the only medicine and we have prescribed for her. She will need to obtain further refills from her primary care physician, Dr. Dema Severin, and Dr. Orest Dikes discretion.  MAGRINAT,GUSTAV C    10/25/2013

## 2013-11-07 ENCOUNTER — Other Ambulatory Visit: Payer: Self-pay | Admitting: Oncology

## 2013-11-07 DIAGNOSIS — C50919 Malignant neoplasm of unspecified site of unspecified female breast: Secondary | ICD-10-CM

## 2014-05-02 ENCOUNTER — Other Ambulatory Visit: Payer: Self-pay | Admitting: Ophthalmology

## 2014-05-02 DIAGNOSIS — G902 Horner's syndrome: Secondary | ICD-10-CM

## 2014-05-03 ENCOUNTER — Other Ambulatory Visit: Payer: Self-pay | Admitting: Ophthalmology

## 2014-05-06 ENCOUNTER — Other Ambulatory Visit: Payer: Self-pay | Admitting: Oncology

## 2014-05-07 ENCOUNTER — Inpatient Hospital Stay: Admission: RE | Admit: 2014-05-07 | Payer: Self-pay | Source: Ambulatory Visit

## 2014-05-07 ENCOUNTER — Ambulatory Visit
Admission: RE | Admit: 2014-05-07 | Discharge: 2014-05-07 | Disposition: A | Discharge status: Home / Self Care | Payer: 59 | Source: Ambulatory Visit | Attending: Ophthalmology | Admitting: Ophthalmology

## 2014-05-07 DIAGNOSIS — G902 Horner's syndrome: Secondary | ICD-10-CM

## 2014-05-07 MED ORDER — IOHEXOL 350 MG/ML SOLN
75.0000 mL | Freq: Once | INTRAVENOUS | Status: AC | PRN
  Administered 2014-05-07: 75 mL via INTRAVENOUS

## 2014-05-09 ENCOUNTER — Telehealth: Payer: Self-pay

## 2014-05-09 NOTE — Telephone Encounter (Signed)
Dr Valetta Close called. 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 He had CT ordered and it showed non specific increased number of bilat lymph nodes. The Brandi Cummings's PCP and Dr Valetta Close both felt the need for Dr Jana Hakim to be aware of this. He is asking that Dr Jana Hakim call him when he gets back in the office. The CT report and message placed on Dr [employer].

## 2014-05-20 ENCOUNTER — Other Ambulatory Visit: Payer: Self-pay | Admitting: Oncology

## 2014-05-22 ENCOUNTER — Telehealth: Payer: Self-pay | Admitting: Oncology

## 2014-05-22 ENCOUNTER — Other Ambulatory Visit: Payer: Self-pay | Admitting: Oncology

## 2014-05-22 DIAGNOSIS — C50919 Malignant neoplasm of unspecified site of unspecified female breast: Secondary | ICD-10-CM

## 2014-05-22 NOTE — Progress Notes (Unsigned)
I was called today by Dr. Noralee Space from [facility] ophthalmology. He evaluated [name] for some visual changes and anisocoria. He thought she might have Horner's syndrome. Because of the breast cancer history he obtain a CT of the head and neck and a chest CT which showed some bilateral small increased number of lymph nodes which were not however increased in size. He is referring her back here for further evaluation.  I am setting Brandi Cummings up for lab work and a PET scan looking for possible myeloproliferative disease.

## 2014-05-22 NOTE — Telephone Encounter (Signed)
per pof to sch pt appt-cld & spoke to pt and gave pt time & date of appt-pt understoo

## 2014-06-12 ENCOUNTER — Other Ambulatory Visit (HOSPITAL_BASED_OUTPATIENT_CLINIC_OR_DEPARTMENT_OTHER): Payer: 59

## 2014-06-12 ENCOUNTER — Encounter (HOSPITAL_COMMUNITY)
Admission: RE | Admit: 2014-06-12 | Discharge: 2014-06-12 | Disposition: A | Discharge status: Home / Self Care | Payer: 59 | Source: Ambulatory Visit | Attending: Oncology | Admitting: Oncology

## 2014-06-12 DIAGNOSIS — Z853 Personal history of malignant neoplasm of breast: Secondary | ICD-10-CM

## 2014-06-12 DIAGNOSIS — C50919 Malignant neoplasm of unspecified site of unspecified female breast: Secondary | ICD-10-CM | POA: Diagnosis present

## 2014-06-12 LAB — COMPREHENSIVE METABOLIC PANEL (CC13)
ALT: 18 U/L (ref 0–55)
AST: 22 U/L (ref 5–34)
Albumin: 4.3 g/dL (ref 3.5–5.0)
Alkaline Phosphatase: 96 U/L (ref 40–150)
Anion Gap: 12 meq/L — ABNORMAL HIGH (ref 3–11)
BUN: 16.8 mg/dL (ref 7.0–26.0)
CO2: 24 meq/L (ref 22–29)
Calcium: 9.5 mg/dL (ref 8.4–10.4)
Chloride: 106 meq/L (ref 98–109)
Creatinine: 0.8 mg/dL (ref 0.6–1.1)
EGFR: 87 ml/min/1.73 m2 — ABNORMAL LOW
Glucose: 102 mg/dL (ref 70–140)
Potassium: 4 meq/L (ref 3.5–5.1)
Sodium: 143 meq/L (ref 136–145)
Total Bilirubin: 1.28 mg/dL — ABNORMAL HIGH (ref 0.20–1.20)
Total Protein: 6.9 g/dL (ref 6.4–8.3)

## 2014-06-12 LAB — CBC WITH DIFFERENTIAL/PLATELET
BASO%: 0.5 % (ref 0.0–2.0)
Basophils Absolute: 0 10e3/uL (ref 0.0–0.1)
EOS%: 5.5 % (ref 0.0–7.0)
Eosinophils Absolute: 0.3 10e3/uL (ref 0.0–0.5)
HCT: 40.7 % (ref 34.8–46.6)
HGB: 13.5 g/dL (ref 11.6–15.9)
LYMPH%: 39.6 % (ref 14.0–49.7)
MCH: 30.9 pg (ref 25.1–34.0)
MCHC: 33.3 g/dL (ref 31.5–36.0)
MCV: 92.8 fL (ref 79.5–101.0)
MONO#: 0.5 10e3/uL (ref 0.1–0.9)
MONO%: 9.7 % (ref 0.0–14.0)
NEUT#: 2.2 10e3/uL (ref 1.5–6.5)
NEUT%: 44.7 % (ref 38.4–76.8)
Platelets: 311 10e3/uL (ref 145–400)
RBC: 4.38 10e6/uL (ref 3.70–5.45)
RDW: 12.8 % (ref 11.2–14.5)
WBC: 4.9 10e3/uL (ref 3.9–10.3)
lymph#: 1.9 10e3/uL (ref 0.9–3.3)

## 2014-06-12 LAB — GLUCOSE, CAPILLARY: Glucose-Capillary: 96 mg/dL (ref 70–99)

## 2014-06-12 LAB — LACTATE DEHYDROGENASE (CC13): LDH: 157 U/L (ref 125–245)

## 2014-06-12 MED ORDER — FLUDEOXYGLUCOSE F - 18 (FDG) INJECTION: 9.4000 | Freq: Once | INTRAVENOUS | Status: AC | PRN

## 2014-06-14 LAB — BETA 2 MICROGLOBULIN, SERUM: Beta-2 Microglobulin: 2.13 mg/L (ref <=2.51)

## 2014-06-19 ENCOUNTER — Ambulatory Visit (HOSPITAL_BASED_OUTPATIENT_CLINIC_OR_DEPARTMENT_OTHER): Payer: 59 | Admitting: Oncology

## 2014-06-19 ENCOUNTER — Telehealth: Payer: Self-pay | Admitting: Oncology

## 2014-06-19 VITALS — BP 123/60 | HR 85 | Temp 98.5°F | Resp 18 | Ht 70.0 in | Wt 193.4 lb

## 2014-06-19 DIAGNOSIS — R590 Localized enlarged lymph nodes: Secondary | ICD-10-CM

## 2014-06-19 DIAGNOSIS — Z853 Personal history of malignant neoplasm of breast: Secondary | ICD-10-CM

## 2014-06-19 DIAGNOSIS — C50912 Malignant neoplasm of unspecified site of left female breast: Secondary | ICD-10-CM

## 2014-06-19 NOTE — Progress Notes (Signed)
ID: Brandi Cummings   DOB: 06-Apr-1956  MR#: [id number]  CSN#:[id number]  PCP: Vidal Schwalbe, MD GYN: SU: Excell Seltzer OTHER MD: Jacelyn Pi, Hope Karren Burly Bowen (ophth)  HISTORY OF PRESENT ILLNESS: From the original intake note:  She noted a lump in her left breast in the fall of 2009 and had mammography at [facility] with [name] on February 17, 2008.  He found the breast to be heterogeneously dense with no interval change by mammography.  Ultrasound showed an oval, well circumscribed hypoechoic mass measuring 1 cm in the 10'clock position, 2 cm from the nipple in the left breast corresponding to the palpable mass.  This was felt to be benign appearing fibrocystic change and a six-month mammogram was suggested.  In January of 2010 she saw Dr. [name] who palpated the mass and he referred her to Dr. [name] for further evaluation.  Dr. Excell Seltzer reviewed the data and offered the patient further observation as already discussed versus excision and the patient chose the latter.  Her excisional biopsy was performed May 28, 2008 and showed an invasive ductal carcinoma, grade 3, measuring 7 mm, with good margins for the invasive component, but what looked like positive margins in the in situ component.  No lymphovascular invasion and a prognostic panel which showed the tumor to be ER and PR negative with a proliferation marker of 34% and HER2/neu negative by CISH with a ratio of 1.21 (PM10-121 and OS10-2305).  With this information, the patient proceeded to bilateral breast MRIs on March 4 with this showed only a seroma in the upper inner quadrant of the left breast measuring 4.9 cm, but otherwise no significant findings on either side and no evidence of internal mammary or axillary lymph node enhancement.  On June 21, 2008 the patient underwent reexcision together with left axillary lymph node biopsy ([id number]) a negative sentinel lymph node with the inferior margin still showing a  microscopic focus of high-grade ductal carcinoma with the deep margins still showing focal involvement by ductal carcinoma in situ.  All the other margins were negative.   Her subsequent history is as detailed below.  INTERVAL HISTORY: I had released Brandi Cummings from follow-up July 2015. However early this year she developed some visual changes which were evaluated by Dr. Valetta Close at [facility] ophthalmology. 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 But also some dizziness and other symptoms which led to a CT angiogram of the head and neck 05/07/2014. This was essentially negative but it did note an increased number of small bilateral cervical lymph nodes, especially around the level for stations. This did not look like metastatic disease but raised the possibility of a primary myeloproliferative problem and Dove was referred back for further evaluation. We set her up for lab work and a PET scan and she is here today to discuss those results.  REVIEW OF SYSTEMS: She had 1 migraine headache in the last year. She currently has no dizziness, nausea, vomiting, or visual changes. She tells me she has chronic gum problems relating to what she thinks was a piece of material left in her gum after oral surgery. This was eventually removed by another oral surgeon, but she was on oral antibiotics for years as a result of this. She also has mild sinus symptoms. His been no cough, phlegm production or pleurisy, and no change in bowel or bladder habits. She continues to have mild discomfort in the  left supraclavicular area. When she takes a shower sometimes her left reconstructed breast turns pink but not the right one. She also feels she has a funny smell in her left armpit but not the right armpit. She has some arthritis. Her diabetes is borderline and generally controlled on her current medications. Rarely she has palpitations. She is not exercising  regularly. A detailed review of systems today was otherwise stable.  PAST MEDICAL HISTORY: Significant for hysterectomy with bilateral salpingo-oophorectomy in 2006 "I kept my cervix."  She has a history of mitral valve prolapse not requiring antibiotics before dental procedures.  She has a history of hypothyroidism, history of migraines, history of mild osteoarthritis particularly involving the hands.  She has a history of in vitro fertilization remotely. New diagnosis of diabetes  FAMILY HISTORY The patient's father died at the age of 93 in the setting of prostate cancer.  The patient's mother is alive in her 63s.  The patient is one of four siblings.  There is no history of breast or ovarian cancer in the family.  GYNECOLOGIC HISTORY: She is Gx, P2 first pregnancy at age 42.  She was still menstruating in 2006.  She did not take hormones after the hysterectomy and bilateral salpingo-oophorectomy.    SOCIAL HISTORY: She is a Agricultural engineer.  Her husband Brandi Cummings Resides works in Engineer, technical sales mostly in [place] and does a lot of traveling.  Both her sons are currently in college.  The patient attends a [employer].   ADVANCED DIRECTIVES: in place  HEALTH MAINTENANCE: History  Substance Use Topics  . Smoking status: Never Smoker   . Smokeless tobacco: Never Used  . Alcohol Use: No     Allergies  Allergen Reactions  . Penicillins   . Lactose Intolerance (Gi)     Does not use dairy without using lactaide  . Shrimp [Shellfish Allergy] Hives    Current Outpatient Prescriptions  Medication Sig Dispense Refill  . cetirizine (ZYRTEC) 10 MG tablet Take 10 mg by mouth at bedtime.     Marland Kitchen doxycycline (ORACEA) 40 MG capsule Take 40 mg by mouth daily.    Marland Kitchen HYDROcodone-acetaminophen (NORCO) 5-325 MG per tablet Take 1-2 tablets by mouth every 6 (six) hours as needed for moderate pain. 50 tablet 0  . metFORMIN (GLUCOPHAGE-XR) 500 MG 24 hr tablet Take 500 mg by mouth at bedtime.     . methocarbamol  (ROBAXIN) 500 MG tablet Take 1 tablet (500 mg total) by mouth 4 (four) times daily. As needed for muscle spasm 30 tablet 1  . naproxen sodium (ANAPROX) 220 MG tablet Take 220 mg by mouth 2 (two) times daily with a meal.    . thyroid (ARMOUR) 60 MG tablet Take 60 mg by mouth daily before breakfast.    . venlafaxine XR (EFFEXOR-XR) 37.5 MG 24 hr capsule Take 1 capsule (37.5 mg total) by mouth daily with breakfast. 90 capsule 1   No current facility-administered medications for this visit.    OBJECTIVE: Middle-aged white woman who appears well Filed Vitals:   06/19/14 0841  BP: 123/60  Pulse: 85  Temp: 98.5 F (36.9 C)  Resp: 18     Body mass index is 27.75 kg/(m^2).    ECOG FS: 0  Sclerae unicteric, pupils round and reactive, the right minimally larger than the left Oropharynx clear, dentition in good repair No palpable cervical or supraclavicular adenopathy; no axillary or inguinal adenopathy Lungs no rales or rhonchi Heart regular rate and rhythm Abd soft, nontender, positive bowel sounds,  no masses palpated MSK no focal spinal tenderness, no upper extremity lymphedema Neuro: nonfocal, well oriented, positive affect Breasts: She is status post bilateral mastectomies with bilateral flap reconstruction. The cosmetic result is excellent. There is no evidence of local recurrence. There is no hidradenitis or other changes of concern in the left axilla.  LAB RESULTS: Lab Results  Component Value Date   WBC 4.9 06/12/2014   NEUTROABS 2.2 06/12/2014   HGB 13.5 06/12/2014   HCT 40.7 06/12/2014   MCV 92.8 06/12/2014   PLT 311 06/12/2014      Chemistry      Component Value Date/Time   NA 143 06/12/2014 0849   NA 138 03/29/2013 0900   K 4.0 06/12/2014 0849   K 4.6 03/29/2013 0900   CL 102 03/29/2013 0900   CL 106 08/17/2012 0834   CO2 24 06/12/2014 0849   CO2 27 03/29/2013 0900   BUN 16.8 06/12/2014 0849   BUN 17 03/29/2013 0900   CREATININE 0.8 06/12/2014 0849   CREATININE  0.66 03/29/2013 0900      Component Value Date/Time   CALCIUM 9.5 06/12/2014 0849   CALCIUM 10.0 03/29/2013 0900   ALKPHOS 96 06/12/2014 0849   ALKPHOS 89 08/18/2011 0909   AST 22 06/12/2014 0849   AST 24 08/18/2011 0909   ALT 18 06/12/2014 0849   ALT 19 08/18/2011 0909   BILITOT 1.28* 06/12/2014 0849   BILITOT 1.0 08/18/2011 0909       Lab Results  Component Value Date   LABCA2 32 08/18/2011    No components found for: [id number]  No results for input(s): INR in the last 168 hours.  Urinalysis No results found for: COLORURINE  STUDIES: Nm Pet Image Initial (pi) Skull Base To Thigh  06/12/2014   CLINICAL DATA:  Subsequent treatment strategy for left breast cancer status post bilateral mastectomy. Multiple small cervical lymph nodes on CT angio head/neck.  EXAM: NUCLEAR MEDICINE PET SKULL BASE TO THIGH  TECHNIQUE: 9.4 mCi F-18 FDG was injected intravenously. Full-ring PET imaging was performed from the skull base to thigh after the radiotracer. CT data was obtained and used for attenuation correction and anatomic localization.  FASTING BLOOD GLUCOSE:  Value: 96 mg/dl  COMPARISON:  None.  FINDINGS: NECK  No hypermetabolic lymph nodes in the neck.  CHEST  Status post bilateral mastectomy with reconstruction and left axillary lymph node dissection.  9 mm right axillary node with preservation of the normal fatty hilum, max SUV 0.9. 5 mm left axillary node (series 4/image 60), max SUV 0.7.  No hypermetabolic mediastinal or hilar nodes.  No suspicious pulmonary nodules on the CT scan.  ABDOMEN/PELVIS  No abnormal hypermetabolic activity within the liver, pancreas, adrenal glands, or spleen.  No hypermetabolic lymph nodes in the abdomen or pelvis.  SKELETON  No focal hypermetabolic activity to suggest skeletal metastasis.  IMPRESSION: Status post bilateral mastectomy with reconstruction and left axillary lymph node dissection.  No evidence of recurrent or metastatic disease.   Electronically  Signed   By: Julian Hy M.D.   On: 06/12/2014 11:45     ASSESSMENT: 59 y.o. [place] woman status post left breast biopsy March 2010 for a triple negative invasive ductal carcinoma, T1bN0 (stage IA) , grade 3, treated adjuvantly with Cytoxan and Taxotere x4, completed in June 2010, followed by bilateral mastectomies and bilateral gluteal artery perforator flap reconstruction.  (1) bilateral minimal cervical adenopathy noted on CT/angioof the neck January 2016, with negative PET scan 06/12/2014  (2)  Gilbert's "disease".   PLAN: I spent approximately 50 minutes reviewing Glenyce is labs and PET scan as well as a CT/angio with her and Brandi Cummings Resides today. She has a normal beta 2 microglobulin and LDH, and these are of course labs which are frequently elevated in lymphoproliferative disorders. She has a mildly elevated total bilirubin, and we went over all her prior total bilirubins, about half of which are also mildly elevated. This is most consistent with Gilbert's "disease," which is related Coralyn Mark and which is manifested by a slightly higher bilirubin during fasting.  We then went over her original CT of the neck, which as noted shows a slightly increased number of lymph nodes which are not themselves otherwise abnormal--innumerable worse the are not enlarged or have an abnormal morphology. There was no other finding of concern in those scans, and her PET scan was otherwise clear  All this was very reassuring to [place]. The slight discomfort she has near her left supraclavicular area is long-standing and chronic and is likely postsurgical. There are going to be some nerve differences between the right and left reconstructed breasts, which is a chance matter, and that would explain why one breast becomes pink with hot water but not the other. She has no hidradenitis on the left so I have no simple explanation as to why the left armpit smells different to her than the right. This very likely can be  due to colonization by different bacteria and I suggested she use an antibiotic soap in the left armpit and see if that clears.  We then discussed whether further follow-up was advisable. After much discussion the plan is for her to return one more time 6 months from now and we will obtain a CT of the neck only before that visit. If that is unchanged or improved, we will release her from follow-up at that time.  Brendolyn is a good understanding of this plan. She agrees with it. She will call with any other problems that may develop before her next visit here. Ko Bardon C    06/19/2014

## 2014-06-19 NOTE — Telephone Encounter (Signed)
appts made and avs printed for pt  Brandi Cummings °

## 2014-06-21 NOTE — Addendum Note (Signed)
Addended by: Laureen Abrahams on: 06/21/2014 03:06 PM   Modules accepted: Orders, Medications

## 2014-11-29 ENCOUNTER — Other Ambulatory Visit (HOSPITAL_COMMUNITY)
Admission: RE | Admit: 2014-11-29 | Discharge: 2014-11-29 | Disposition: A | Discharge status: Home / Self Care | Payer: BLUE CROSS/BLUE SHIELD | Source: Ambulatory Visit | Attending: Family Medicine | Admitting: Family Medicine

## 2014-11-29 ENCOUNTER — Other Ambulatory Visit: Payer: Self-pay | Admitting: Family Medicine

## 2014-11-29 DIAGNOSIS — Z124 Encounter for screening for malignant neoplasm of cervix: Secondary | ICD-10-CM | POA: Diagnosis present

## 2014-11-29 DIAGNOSIS — Z1151 Encounter for screening for human papillomavirus (HPV): Secondary | ICD-10-CM | POA: Diagnosis present

## 2014-11-30 LAB — CYTOLOGY - PAP

## 2014-12-19 ENCOUNTER — Ambulatory Visit (HOSPITAL_COMMUNITY)
Admission: RE | Admit: 2014-12-19 | Discharge: 2014-12-19 | Disposition: A | Discharge status: Home / Self Care | Payer: BLUE CROSS/BLUE SHIELD | Source: Ambulatory Visit | Attending: Oncology | Admitting: Oncology

## 2014-12-19 ENCOUNTER — Encounter (HOSPITAL_COMMUNITY): Payer: Self-pay

## 2014-12-19 ENCOUNTER — Other Ambulatory Visit: Payer: Self-pay | Admitting: *Deleted

## 2014-12-19 ENCOUNTER — Other Ambulatory Visit: Payer: Self-pay | Admitting: Oncology

## 2014-12-19 ENCOUNTER — Ambulatory Visit (HOSPITAL_BASED_OUTPATIENT_CLINIC_OR_DEPARTMENT_OTHER): Payer: BLUE CROSS/BLUE SHIELD

## 2014-12-19 DIAGNOSIS — Z853 Personal history of malignant neoplasm of breast: Secondary | ICD-10-CM

## 2014-12-19 DIAGNOSIS — C50912 Malignant neoplasm of unspecified site of left female breast: Secondary | ICD-10-CM | POA: Diagnosis present

## 2014-12-19 LAB — CBC WITH DIFFERENTIAL/PLATELET
BASO%: 0.7 % (ref 0.0–2.0)
BASOS ABS: 0 10*3/uL (ref 0.0–0.1)
EOS ABS: 0.1 10*3/uL (ref 0.0–0.5)
EOS%: 2.6 % (ref 0.0–7.0)
HCT: 41 % (ref 34.8–46.6)
HEMOGLOBIN: 13.8 g/dL (ref 11.6–15.9)
LYMPH%: 37.3 % (ref 14.0–49.7)
MCH: 31.7 pg (ref 25.1–34.0)
MCHC: 33.7 g/dL (ref 31.5–36.0)
MCV: 94.3 fL (ref 79.5–101.0)
MONO#: 0.5 10*3/uL (ref 0.1–0.9)
MONO%: 11.1 % (ref 0.0–14.0)
NEUT#: 2.2 10*3/uL (ref 1.5–6.5)
NEUT%: 48.3 % (ref 38.4–76.8)
Platelets: 322 10*3/uL (ref 145–400)
RBC: 4.35 10*6/uL (ref 3.70–5.45)
RDW: 12.5 % (ref 11.2–14.5)
WBC: 4.6 10*3/uL (ref 3.9–10.3)
lymph#: 1.7 10*3/uL (ref 0.9–3.3)

## 2014-12-19 LAB — COMPREHENSIVE METABOLIC PANEL (CC13)
ALT: 22 U/L (ref 0–55)
AST: 26 U/L (ref 5–34)
Albumin: 4.2 g/dL (ref 3.5–5.0)
Alkaline Phosphatase: 103 U/L (ref 40–150)
Anion Gap: 9 mEq/L (ref 3–11)
BUN: 12.2 mg/dL (ref 7.0–26.0)
CO2: 26 mEq/L (ref 22–29)
Calcium: 9.8 mg/dL (ref 8.4–10.4)
Chloride: 108 mEq/L (ref 98–109)
Creatinine: 0.8 mg/dL (ref 0.6–1.1)
EGFR: 85 mL/min/{1.73_m2} — ABNORMAL LOW (ref >90)
GLUCOSE: 100 mg/dL (ref 70–140)
Potassium: 4.6 mEq/L (ref 3.5–5.1)
SODIUM: 143 meq/L (ref 136–145)
Total Bilirubin: 1.25 mg/dL — ABNORMAL HIGH (ref 0.20–1.20)
Total Protein: 6.9 g/dL (ref 6.4–8.3)

## 2014-12-19 MED ORDER — IOHEXOL 300 MG/ML  SOLN
75.0000 mL | Freq: Once | INTRAMUSCULAR | Status: AC | PRN
  Administered 2014-12-19: 75 mL via INTRAVENOUS

## 2014-12-20 ENCOUNTER — Ambulatory Visit (HOSPITAL_COMMUNITY): Payer: BLUE CROSS/BLUE SHIELD

## 2014-12-20 ENCOUNTER — Ambulatory Visit (HOSPITAL_COMMUNITY): Payer: 59

## 2014-12-20 ENCOUNTER — Telehealth: Payer: Self-pay

## 2014-12-20 NOTE — Telephone Encounter (Signed)
Called patient per Dr. Jana Hakim regarding CT scan results.  Patient was told that "CT is benign and the lymph nodes look benign".  Patient sounded relieved and will see Dr. Jana Hakim on 12/25/14 to discuss further.

## 2014-12-25 ENCOUNTER — Ambulatory Visit (HOSPITAL_BASED_OUTPATIENT_CLINIC_OR_DEPARTMENT_OTHER): Payer: BLUE CROSS/BLUE SHIELD | Admitting: Oncology

## 2014-12-25 VITALS — BP 122/58 | HR 80 | Temp 98.3°F | Resp 18 | Ht 70.0 in | Wt 198.7 lb

## 2014-12-25 DIAGNOSIS — Z853 Personal history of malignant neoplasm of breast: Secondary | ICD-10-CM | POA: Diagnosis not present

## 2014-12-25 DIAGNOSIS — C50912 Malignant neoplasm of unspecified site of left female breast: Secondary | ICD-10-CM

## 2014-12-25 NOTE — Progress Notes (Signed)
ID: Danie Binder   DOB: 11-17-1955  MR#: [id number]  CSN#:[id number]  PCP: Vidal Schwalbe, MD GYN: SU: Excell Seltzer OTHER MD: Jacelyn Pi, Hope Karren Burly Bowen (ophth)  HISTORY OF PRESENT ILLNESS: From the original intake note:  She noted a lump in her left breast in the fall of 2009 and had mammography at [facility] with [name] on February 17, 2008.  He found the breast to be heterogeneously dense with no interval change by mammography.  Ultrasound showed an oval, well circumscribed hypoechoic mass measuring 1 cm in the 10'clock position, 2 cm from the nipple in the left breast corresponding to the palpable mass.  This was felt to be benign appearing fibrocystic change and a six-month mammogram was suggested.  In January of 2010 she saw Dr. [name] who palpated the mass and he referred her to Dr. [name] for further evaluation.  Dr. Excell Seltzer reviewed the data and offered the patient further observation as already discussed versus excision and the patient chose the latter.  Her excisional biopsy was performed May 28, 2008 and showed an invasive ductal carcinoma, grade 3, measuring 7 mm, with good margins for the invasive component, but what looked like positive margins in the in situ component.  No lymphovascular invasion and a prognostic panel which showed the tumor to be ER and PR negative with a proliferation marker of 34% and HER2/neu negative by CISH with a ratio of 1.21 (PM10-121 and OS10-2305).  With this information, the patient proceeded to bilateral breast MRIs on March 4 with this showed only a seroma in the upper inner quadrant of the left breast measuring 4.9 cm, but otherwise no significant findings on either side and no evidence of internal mammary or axillary lymph node enhancement.  On June 21, 2008 the patient underwent reexcision together with left axillary lymph node biopsy ([id number]) a negative sentinel lymph node with the inferior margin still showing a  microscopic focus of high-grade ductal carcinoma with the deep margins still showing focal involvement by ductal carcinoma in situ.  All the other margins were negative.   Her subsequent history is as detailed below.  INTERVAL HISTORY: Johni returns today to review the results of her repeat neck CT scan. Recall that some lymph nodes in the neck had been noted, not pathologic by size but slightly increased in number. It was felt that this required further follow-up so we set her up for repeat CT of the neck, just performed, and the results are very reassuring. The lymph nodes appear benign. At this point no further follow-up at that concern is needed.  REVIEW OF SYSTEMS: She still has problems with hot flashes, she has arthritis chiefly involving her hands, and she has some anxiety which is not uncommon of course in cancer patients especially when they are is being seen in the cancer center. A detailed review of systems was otherwise negative.  PAST MEDICAL HISTORY: Significant for hysterectomy with bilateral salpingo-oophorectomy in 2006 "I kept my cervix."  She has a history of mitral valve prolapse not requiring antibiotics before dental procedures.  She has a history of hypothyroidism, history of migraines, history of mild osteoarthritis particularly involving the hands.  She has a history of in vitro fertilization remotely. New diagnosis of diabetes  FAMILY HISTORY The patient's father died at the age of 31 in the setting of prostate cancer.  The patient's mother is alive in her 69s.  The patient is one of four siblings.  There is no history of  breast or ovarian cancer in the family.  GYNECOLOGIC HISTORY: She is Gx, P2 first pregnancy at age 65.  She was still menstruating in 2006.  She did not take hormones after the hysterectomy and bilateral salpingo-oophorectomy.    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  Both  her sons are currently in college.  The patient attends a [employer].   ADVANCED DIRECTIVES: in place  HEALTH MAINTENANCE: Social History  Substance Use Topics  . Smoking status: Never Smoker   . Smokeless tobacco: Never Used  . Alcohol Use: No     Allergies  Allergen Reactions  . Penicillins   . Lactose Intolerance (Gi)     Does not use dairy without using lactaide  . Shrimp [Shellfish Allergy] Hives    Current Outpatient Prescriptions  Medication Sig Dispense Refill  . cetirizine (ZYRTEC) 10 MG tablet Take 10 mg by mouth at bedtime.     . metFORMIN (GLUCOPHAGE-XR) 500 MG 24 hr tablet Take 500 mg by mouth at bedtime.     Marland Kitchen thyroid (ARMOUR) 60 MG tablet Take 60 mg by mouth daily before breakfast.    . venlafaxine XR (EFFEXOR-XR) 37.5 MG 24 hr capsule Take 1 capsule (37.5 mg total) by mouth daily with breakfast. 90 capsule 1   No current facility-administered medications for this visit.    OBJECTIVE: Middle-aged white woman who appears well Filed Vitals:   12/25/14 1228  BP: 122/58  Pulse: 80  Temp: 98.3 F (36.8 C)  Resp: 18     Body mass index is 28.51 kg/(m^2).    ECOG FS: 0  She is status post bilateral mastectomies.  LAB RESULTS: Lab Results  Component Value Date   WBC 4.6 12/19/2014   NEUTROABS 2.2 12/19/2014   HGB 13.8 12/19/2014   HCT 41.0 12/19/2014   MCV 94.3 12/19/2014   PLT 322 12/19/2014      Chemistry      Component Value Date/Time   NA 143 12/19/2014 1008   NA 138 03/29/2013 0900   K 4.6 12/19/2014 1008   K 4.6 03/29/2013 0900   CL 102 03/29/2013 0900   CL 106 08/17/2012 0834   CO2 26 12/19/2014 1008   CO2 27 03/29/2013 0900   BUN 12.2 12/19/2014 1008   BUN 17 03/29/2013 0900   CREATININE 0.8 12/19/2014 1008   CREATININE 0.66 03/29/2013 0900      Component Value Date/Time   CALCIUM 9.8 12/19/2014 1008   CALCIUM 10.0 03/29/2013 0900   ALKPHOS 103 12/19/2014 1008   ALKPHOS 89 08/18/2011 0909   AST 26 12/19/2014 1008    AST 24 08/18/2011 0909   ALT 22 12/19/2014 1008   ALT 19 08/18/2011 0909   BILITOT 1.25* 12/19/2014 1008   BILITOT 1.0 08/18/2011 0909       Lab Results  Component Value Date   LABCA2 32 08/18/2011    No components found for: [id number]  No results for input(s): INR in the last 168 hours.  Urinalysis No results found for: COLORURINE  STUDIES: Ct Soft Tissue Neck W Contrast  12/19/2014   CLINICAL DATA:  Possible adenopathy on cta head and neck. Breast cancer.  EXAM: CT NECK WITH CONTRAST  TECHNIQUE: Multidetector CT imaging of the neck was performed using the standard protocol following the bolus administration of intravenous contrast.  CONTRAST:  61m OMNIPAQUE IOHEXOL 300 MG/ML  SOLN  COMPARISON:  CTA head  neck 05/07/2014  FINDINGS: Pharynx and larynx: No evidence of mass lesion or pathologic enhancement.  Salivary glands: Unremarkable  Thyroid: Interval decrease in volume. Question radiation relationship if in portal. No nodule.  Lymph nodes: Bilateral cervical chain lymph nodes are prominent in number but not enlarged or necrotic appearing. Lower central compartment nodes have decreased in size since previous. No nodal hyper metabolism on interval PET-CT.  Vascular: Major cervical vessels are patent. Mild atheromatous thickening of the proximal carotid arteries.  Limited intracranial: Negative  Visualized orbits: Negative  Mastoids and visualized paranasal sinuses: Clear  Skeleton: No acute findings or aggressive lytic or blastic lesion.  Upper chest: No apical pulmonary nodules.  IMPRESSION: 1. Benign-appearing cervical lymph nodes. 2. Decreased thyroidal volume since 05/07/2014. Please correlate with thyroid function tests.   Electronically Signed   By: Monte Fantasia M.D.   On: 12/19/2014 12:13     ASSESSMENT: 59 y.o. [place] woman status post left breast biopsy March 2010 for a triple negative invasive ductal carcinoma, T1bN0 (stage IA) , grade 3, treated adjuvantly with Cytoxan  and Taxotere x4, completed in June 2010, followed by bilateral mastectomies and bilateral gluteal artery perforator flap reconstruction.  (1) bilateral minimal cervical adenopathy noted on CT/angioof the neck January 2016, with negative PET scan 06/12/2014  (2) Gilbert's "disease".   PLAN: [place] is repeat CT scan shows no cervical abnormality in terms of adenopathy. I don't believe any further scanning of this area is warranted.  As far as breast cancer follow-up is concerned all she needs is a yearly chest wall exam by her physician.  Accordingly she is being released from follow-up here. She is very happy about that and very agreeable to it, but she also understands that I will be glad to see her at any point in the future if and when the need arises. We are simply not making any further routine appointments for her here.   Divinity Kyler C    12/25/2014

## 2015-08-06 ENCOUNTER — Other Ambulatory Visit: Payer: Self-pay | Admitting: Otolaryngology

## 2015-08-06 DIAGNOSIS — H90A22 Sensorineural hearing loss, unilateral, left ear, with restricted hearing on the contralateral side: Secondary | ICD-10-CM

## 2015-08-11 ENCOUNTER — Ambulatory Visit
Admission: RE | Admit: 2015-08-11 | Discharge: 2015-08-11 | Disposition: A | Discharge status: Home / Self Care | Payer: BLUE CROSS/BLUE SHIELD | Source: Ambulatory Visit | Attending: Otolaryngology | Admitting: Otolaryngology

## 2015-08-11 DIAGNOSIS — H90A22 Sensorineural hearing loss, unilateral, left ear, with restricted hearing on the contralateral side: Secondary | ICD-10-CM

## 2015-08-11 MED ORDER — GADOBENATE DIMEGLUMINE 529 MG/ML IV SOLN
17.0000 mL | Freq: Once | INTRAVENOUS | Status: AC | PRN
  Administered 2015-08-11: 17 mL via INTRAVENOUS

## 2016-03-05 IMAGING — CT CT NECK W/ CM
4 of 5 series · 15 of 33 positions shown, 17 images · IV contrast (omnipaque)
Comparison: CTA head neck 05/07/2014

CLINICAL DATA: Possible adenopathy on cta head and neck. Breast
cancer.

EXAM:
CT NECK WITH CONTRAST
TECHNIQUE: Multidetector CT imaging of the neck was performed using the
standard protocol following the bolus administration of intravenous
contrast.
CONTRAST:  75mL OMNIPAQUE IOHEXOL 300 MG/ML  SOLN

[Series 2: neck st · axial · 0.51mm/px · z∈[-234,-94]mm · 4 of 118 slices shown, 5 images]
[im 24/118  soft-tissue]
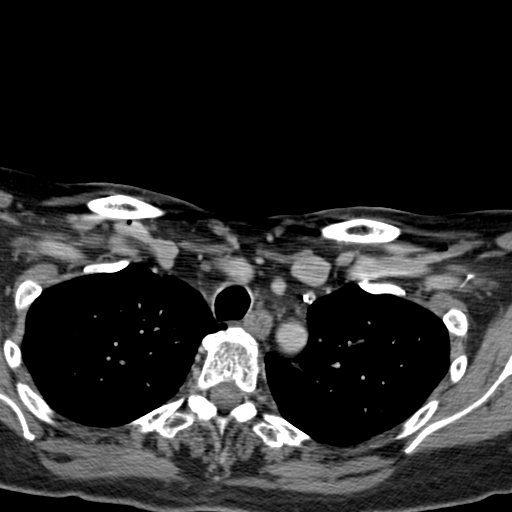
[im 24/118  bone]
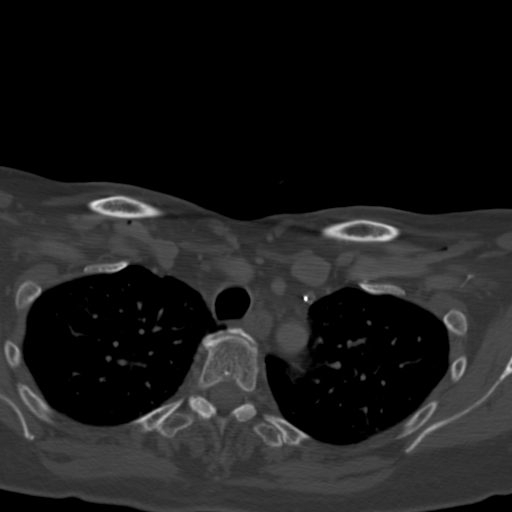
[im 47/118  bone]
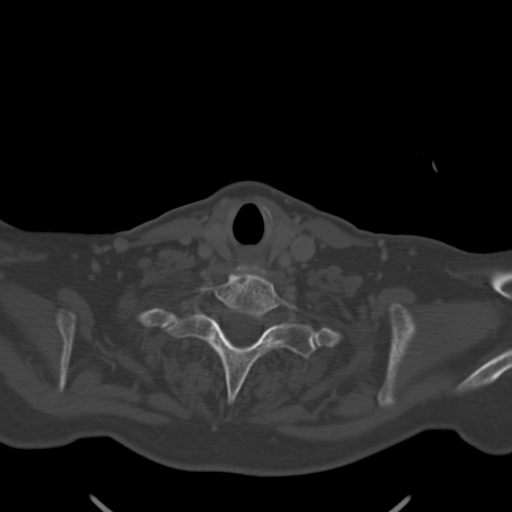
[im 71/118  bone]
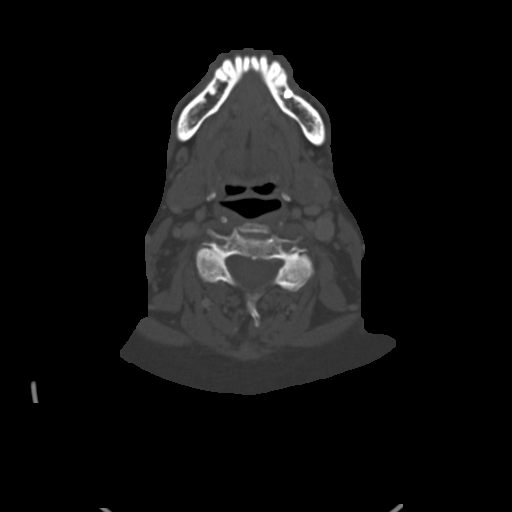
[im 94/118  bone]
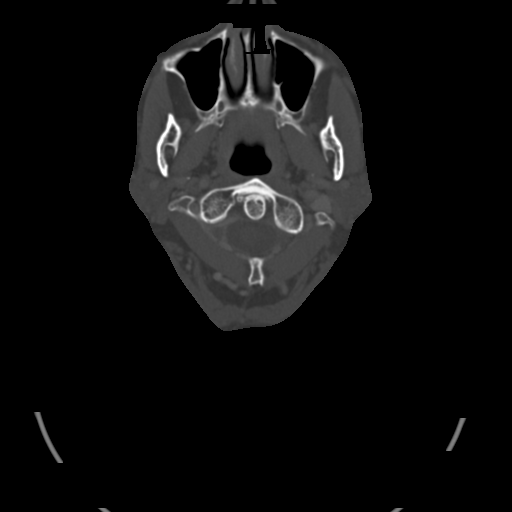

[Series 602: cor · coronal · 0.51mm/px · 3 of 59 slices shown]
[im 19/59  bone]
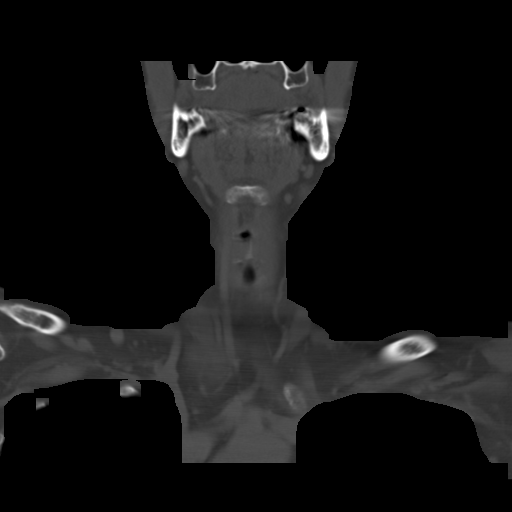
[im 26/59  bone]
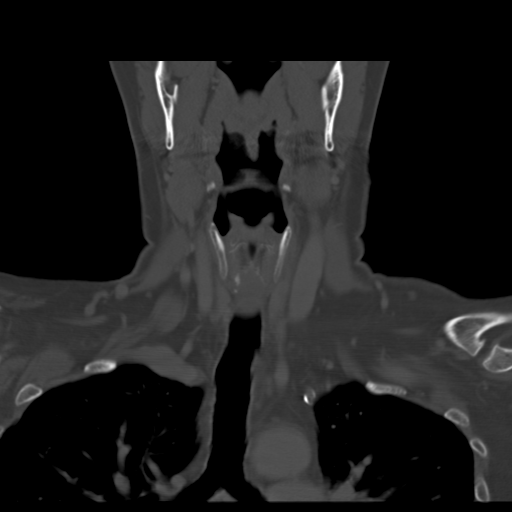
[im 33/59  bone]
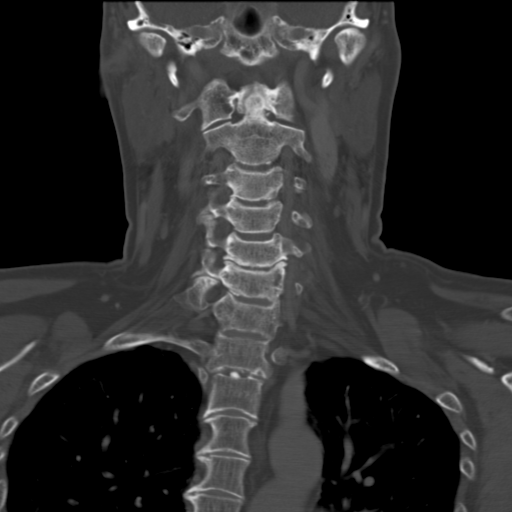

[Series 603: ang.axials · axial · 0.51mm/px · z∈[-297,-174]mm · 3 of 94 slices shown]
[im 24/94  bone]
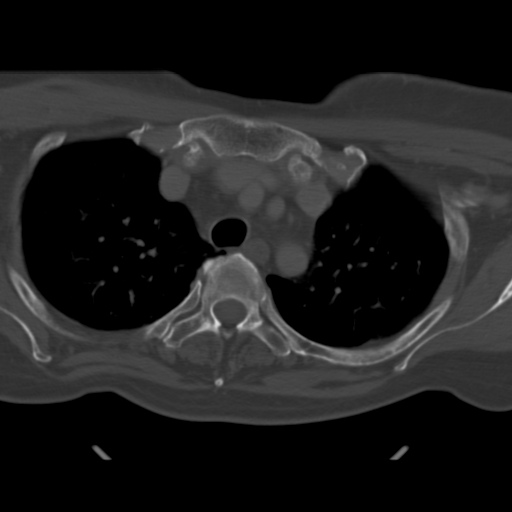
[im 47/94  bone]
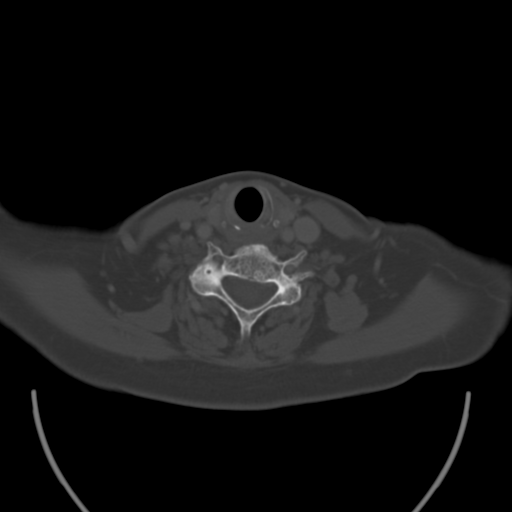
[im 70/94  bone]
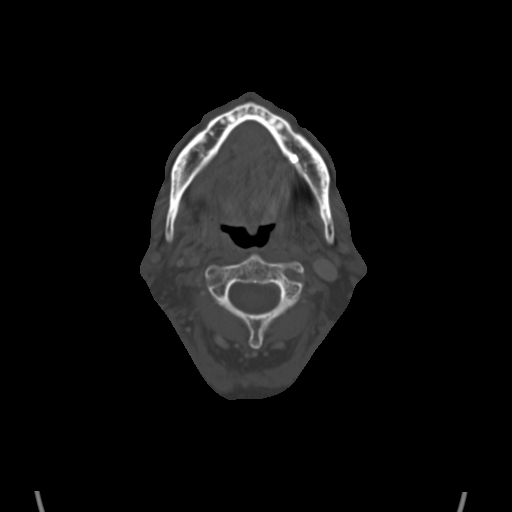

[Series 604: sag · sagittal · 0.51mm/px · 5 of 59 slices shown, 6 images]
[im 20/59  bone]
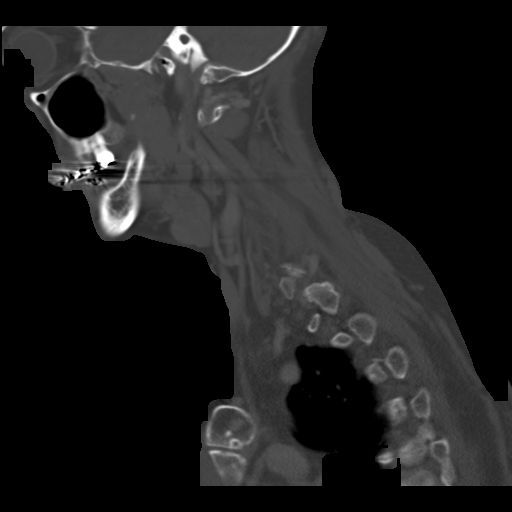
[im 25/59  bone]
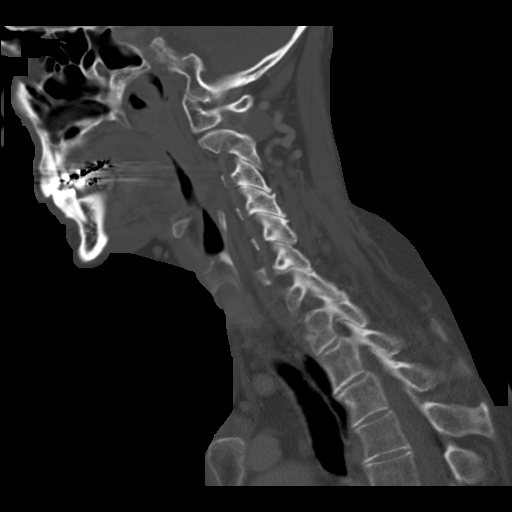
[im 30/59  soft-tissue]
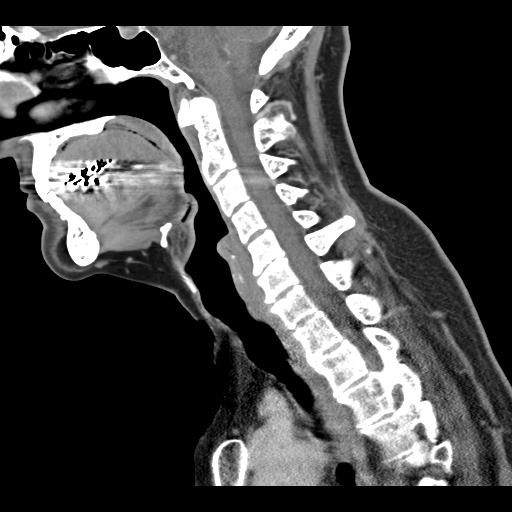
[im 30/59  bone]
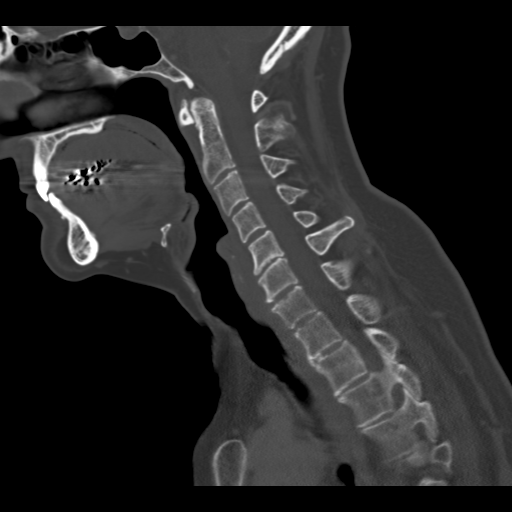
[im 34/59  bone]
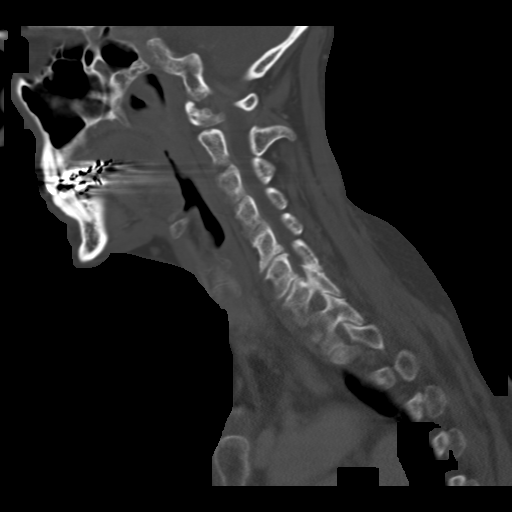
[im 39/59  bone]
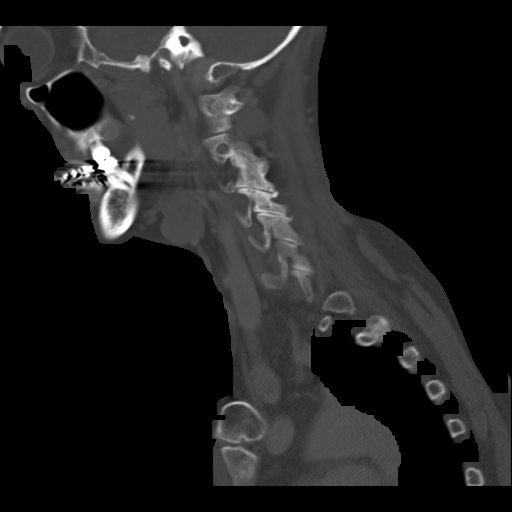

[15 of 33 positions shown; findings below may reference images not displayed]

FINDINGS: Pharynx and larynx: No evidence of mass lesion or pathologic
enhancement.

Salivary glands: Unremarkable

Thyroid: Interval decrease in volume. Question radiation
relationship if in portal. No nodule.

Lymph nodes: Bilateral cervical chain lymph nodes are prominent in
number but not enlarged or necrotic appearing. Lower central
compartment nodes have decreased in size since previous. No nodal
hyper metabolism on interval PET-CT.

Vascular: Major cervical vessels are patent. Mild atheromatous
thickening of the proximal carotid arteries.

Limited intracranial: Negative

Visualized orbits: Negative

Mastoids and visualized paranasal sinuses: Clear

Skeleton: No acute findings or aggressive lytic or blastic lesion.

Upper chest: No apical pulmonary nodules.
IMPRESSION: 1. Benign-appearing cervical lymph nodes.
2. Decreased thyroidal volume since 05/07/2014. Please correlate
with thyroid function tests.

## 2018-05-14 DIAGNOSIS — H6691 Otitis media, unspecified, right ear: Secondary | ICD-10-CM | POA: Diagnosis not present

## 2018-05-17 DIAGNOSIS — H9042 Sensorineural hearing loss, unilateral, left ear, with unrestricted hearing on the contralateral side: Secondary | ICD-10-CM | POA: Diagnosis not present

## 2018-05-17 DIAGNOSIS — H60331 Swimmer's ear, right ear: Secondary | ICD-10-CM | POA: Diagnosis not present

## 2018-05-24 DIAGNOSIS — H9042 Sensorineural hearing loss, unilateral, left ear, with unrestricted hearing on the contralateral side: Secondary | ICD-10-CM | POA: Diagnosis not present

## 2018-05-24 DIAGNOSIS — H60331 Swimmer's ear, right ear: Secondary | ICD-10-CM | POA: Diagnosis not present

## 2018-07-05 DIAGNOSIS — E039 Hypothyroidism, unspecified: Secondary | ICD-10-CM | POA: Diagnosis not present

## 2018-07-05 DIAGNOSIS — E7800 Pure hypercholesterolemia, unspecified: Secondary | ICD-10-CM | POA: Diagnosis not present

## 2018-07-05 DIAGNOSIS — E1165 Type 2 diabetes mellitus with hyperglycemia: Secondary | ICD-10-CM | POA: Diagnosis not present

## 2018-07-06 DIAGNOSIS — E039 Hypothyroidism, unspecified: Secondary | ICD-10-CM | POA: Diagnosis not present

## 2018-07-06 DIAGNOSIS — E7800 Pure hypercholesterolemia, unspecified: Secondary | ICD-10-CM | POA: Diagnosis not present

## 2018-07-06 DIAGNOSIS — E1165 Type 2 diabetes mellitus with hyperglycemia: Secondary | ICD-10-CM | POA: Diagnosis not present

## 2018-08-10 DIAGNOSIS — Z139 Encounter for screening, unspecified: Secondary | ICD-10-CM | POA: Diagnosis not present

## 2018-10-05 DIAGNOSIS — E7800 Pure hypercholesterolemia, unspecified: Secondary | ICD-10-CM | POA: Diagnosis not present

## 2018-10-13 DIAGNOSIS — H524 Presbyopia: Secondary | ICD-10-CM | POA: Diagnosis not present

## 2018-10-13 DIAGNOSIS — H40013 Open angle with borderline findings, low risk, bilateral: Secondary | ICD-10-CM | POA: Diagnosis not present

## 2018-10-13 DIAGNOSIS — E119 Type 2 diabetes mellitus without complications: Secondary | ICD-10-CM | POA: Diagnosis not present

## 2018-10-21 DIAGNOSIS — H903 Sensorineural hearing loss, bilateral: Secondary | ICD-10-CM | POA: Diagnosis not present

## 2018-10-27 DIAGNOSIS — R748 Abnormal levels of other serum enzymes: Secondary | ICD-10-CM | POA: Diagnosis not present

## 2018-12-09 DIAGNOSIS — M1712 Unilateral primary osteoarthritis, left knee: Secondary | ICD-10-CM | POA: Diagnosis not present

## 2018-12-09 DIAGNOSIS — M25562 Pain in left knee: Secondary | ICD-10-CM | POA: Diagnosis not present

## 2019-03-17 DIAGNOSIS — M25562 Pain in left knee: Secondary | ICD-10-CM | POA: Diagnosis not present

## 2019-05-11 ENCOUNTER — Ambulatory Visit: Payer: BLUE CROSS/BLUE SHIELD | Attending: Internal Medicine

## 2019-05-11 DIAGNOSIS — Z20822 Contact with and (suspected) exposure to covid-19: Secondary | ICD-10-CM

## 2019-05-12 LAB — NOVEL CORONAVIRUS, NAA: SARS-CoV-2, NAA: NOT DETECTED

## 2021-07-03 ENCOUNTER — Other Ambulatory Visit: Payer: Self-pay

## 2021-07-03 ENCOUNTER — Ambulatory Visit (INDEPENDENT_AMBULATORY_CARE_PROVIDER_SITE_OTHER): Payer: Medicare Other

## 2021-07-03 ENCOUNTER — Encounter: Payer: Self-pay | Admitting: Emergency Medicine

## 2021-07-03 ENCOUNTER — Ambulatory Visit (INDEPENDENT_AMBULATORY_CARE_PROVIDER_SITE_OTHER): Payer: Medicare Other | Admitting: Emergency Medicine

## 2021-07-03 DIAGNOSIS — R053 Chronic cough: Secondary | ICD-10-CM

## 2021-07-03 MED ORDER — FAMOTIDINE 20 MG PO TABS: 20.0000 mg | ORAL_TABLET | Freq: Two times a day (BID) | ORAL | 0 refills | Status: AC

## 2021-07-03 MED ORDER — CETIRIZINE HCL 10 MG PO TABS: 10.0000 mg | ORAL_TABLET | Freq: Every day | ORAL | 2 refills | Status: AC

## 2021-07-03 NOTE — Assessment & Plan Note (Signed)
Sounds like a component of upper airway lability and irritation with an index event following COVID-19 infection in 12/2019.  She does have periods when her cough is quiet but always brought back on with triggers such as viral infections.  Chronic rhinitis and GERD may be low-level contributors.  Were going to work on treating these more consistently.  We talked about cough suppression and avoiding throat clearing.  She needs a chest x-ray and pulmonary function testing to rule out any sequela of COVID-19 such as interstitial disease or new asthma. ? ?Chest x-ray today ?We will perform pulmonary function testing in next office visit. ?Okay to continue use albuterol 2 puffs if needed for shortness of breath, chest tightness, wheezing, spells of coughing ?Start taking your Pepcid 20 mg every day. ?Start taking your Zyrtec reliably every day. ?Try to avoid throat clearing, suppress your cough if at all possible ?Follow Dr. Lamonte Sakai next available with PFT on the same day ?

## 2021-07-03 NOTE — Progress Notes (Signed)
? ?Subjective:  ? ? Patient ID: Brandi Cummings, female    DOB: Sep 02, 1955, 66 y.o.   MRN: [id number] ? ?HPI ?66 year old woman, never smoker, with a history of left breast cancer, diabetes, mitral valve prolapse, hypothyroidism, hyperlipidemia, depression.  She is here for chronic recurrent cough.  She had COVID-19 in 12/2019.  She reports that she had cough, HA, fatigue. The cough persisted for months, finally resolved. She has had recurrence of prolonged cough with more recent URI's. Has had some associated dyspnea, heard some noise ? Wheeze. Tried albuterol w some relief of cough and dyspnea. She has some underlying reflux, did try to treat this with the recent findings > pepcid qd for a while. She is on zyrtec. The cough is better, still happens, sometimes productive. She has some ear fullness.  ? ?Treated with doxycycline for possible associated sinusitis/bronchitis 05/09/2021 ? ? ?Review of Systems ?As per HPI ? ?Past Medical History:  ?Diagnosis Date  ? Cancer [facility])   ? Depression   ? Diabetes mellitus   ? Hyperlipidemia   ? Mononucleosis   ? as a teen  ? MVP (mitral valve prolapse) 1990  ? was told  yrs ago- eccho 2010  in [place]  ? Thyroid disease   ?  ? ?Family History  ?Problem Relation Age of Onset  ? Diabetes Mother   ? Stroke Mother   ? Hypertension Mother   ? Cancer Father   ? Heart attack Father   ?  ? ?Social History  ? ?Socioeconomic History  ? Marital status: Widowed  ?  Spouse name: Not on file  ? Number of children: Not on file  ? Years of education: Not on file  ? Highest education level: Not on file  ?Occupational History  ? Not on file  ?Tobacco Use  ? Smoking status: Never  ? Smokeless tobacco: Never  ?Substance and Sexual Activity  ? Alcohol use: No  ? Drug use: No  ? Sexual activity: Not on file  ?Other Topics Concern  ? Not on file  ?Social History Narrative  ? Not on file  ? ?Social Determinants of Health  ? ?Financial Resource Strain: Not on file  ?Food Insecurity: Not on file   ?Transportation Needs: Not on file  ?Physical Activity: Not on file  ?Stress: Not on file  ?Social Connections: Not on file  ?Intimate Partner Violence: Not on file  ?  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 ?Allergies  ?Allergen Reactions  ? Penicillins   ? Lactose Intolerance (Gi)   ?  Does not use dairy without using lactaide  ? Shrimp [Shellfish Allergy] Hives  ?  ? ?Outpatient Medications Prior to Visit  ?Medication Sig Dispense Refill  ? cetirizine (ZYRTEC) 10 MG tablet Take 10 mg by mouth at bedtime.     ? rosuvastatin (CRESTOR) 10 MG tablet Take 1 tablet by mouth daily.    ? thyroid (ARMOUR) 60 MG tablet Take 60 mg by mouth daily before breakfast.    ? albuterol (VENTOLIN HFA) 108 (90 Base) MCG/ACT inhaler Inhale 2 puffs into the lungs every 6 (six) hours as needed.    ? metFORMIN (GLUCOPHAGE-XR) 500 MG 24 hr tablet Take 500 mg by mouth at bedtime.     ? venlafaxine XR (EFFEXOR-XR) 37.5 MG 24 hr capsule Take 1 capsule (37.5 mg total) by mouth daily with breakfast. 90 capsule 1  ? ?No facility-administered medications prior to visit.  ? ? ? ?   ?Objective:  ? Physical  Exam ?Vitals:  ? 07/03/21 1007  ?BP: 120/68  ?Pulse: 97  ?Temp: 98.1 ?F (36.7 ?C)  ?TempSrc: Oral  ?SpO2: 98%  ?Weight: 202 lb 6.4 oz (91.8 kg)  ?Height: 5' 9.5" (1.765 m)  ? ?Gen: Pleasant, well-nourished, in no distress,  normal affect ? ?ENT: No lesions,  mouth clear,  oropharynx clear, no postnasal drip ? ?Neck: No JVD, no stridor ? ?Lungs: No use of accessory muscles, no crackles or wheezing on normal respiration, no wheeze on forced expiration ? ?Cardiovascular: RRR, heart sounds normal, no murmur or gallops, no peripheral edema ? ?Musculoskeletal: No deformities, no cyanosis or clubbing ? ?Neuro: alert, awake, non focal ? ?Skin: Warm, no lesions or rash ? ?   ?Assessment & Plan:  ?Chronic cough ?Sounds like a component of upper airway lability and irritation with an index event following COVID-19 infection in 12/2019.  She does have periods when her  cough is quiet but always brought back on with triggers such as viral infections.  Chronic rhinitis and GERD may be low-level contributors.  Were going to work on treating these more consistently.  We talked about cough suppression and avoiding throat clearing.  She needs a chest x-ray and pulmonary function testing to rule out any sequela of COVID-19 such as interstitial disease or new asthma. ? ?Chest x-ray today ?We will perform pulmonary function testing in next office visit. ?Okay to continue use albuterol 2 puffs if needed for shortness of breath, chest tightness, wheezing, spells of coughing ?Start taking your Pepcid 20 mg every day. ?Start taking your Zyrtec reliably every day. ?Try to avoid throat clearing, suppress your cough if at all possible ?Follow Dr. Lamonte Sakai next available with PFT on the same day ? ? ?Baltazar Apo, MD, PhD ?07/03/2021, 10:45 AM ?[facility] ?[phone] or if no answer before 7:00PM call [phone] ?For any issues after 7:00PM please call eLink [phone] ? ? ?

## 2021-07-03 NOTE — Addendum Note (Signed)
Addended by: Gavin Potters R on: 07/03/2021 10:52 AM ? ? Modules accepted: Orders ? ?

## 2021-07-03 NOTE — Patient Instructions (Addendum)
Chest x-ray today ?We will perform pulmonary function testing in next office visit. ?Okay to continue use albuterol 2 puffs if needed for shortness of breath, chest tightness, wheezing, spells of coughing ?Start taking your Pepcid 20 mg every day. ?Start taking your Zyrtec reliably every day. ?Try to avoid throat clearing, suppress your cough if at all possible ?Follow Dr. Lamonte Sakai next available with PFT on the same day ?

## 2021-07-10 ENCOUNTER — Ambulatory Visit (INDEPENDENT_AMBULATORY_CARE_PROVIDER_SITE_OTHER): Payer: Medicare Other | Admitting: Emergency Medicine

## 2021-07-10 ENCOUNTER — Encounter: Payer: Self-pay | Admitting: Emergency Medicine

## 2021-07-10 DIAGNOSIS — R053 Chronic cough: Secondary | ICD-10-CM

## 2021-07-10 LAB — PULMONARY FUNCTION TEST
DL/VA % pred: 114 %
DL/VA: 4.58 ml/min/mmHg/L
DLCO cor % pred: 103 %
DLCO cor: 23.99 ml/min/mmHg
DLCO unc % pred: 103 %
DLCO unc: 23.99 ml/min/mmHg
FEF 25-75 Post: 3.04 L/sec
FEF 25-75 Pre: 3.02 L/sec
FEF2575-%Change-Post: 0 %
FEF2575-%Pred-Post: 127 %
FEF2575-%Pred-Pre: 126 %
FEV1-%Change-Post: 0 %
FEV1-%Pred-Post: 100 %
FEV1-%Pred-Pre: 100 %
FEV1-Post: 2.89 L
FEV1-Pre: 2.9 L
FEV1FVC-%Change-Post: 0 %
FEV1FVC-%Pred-Pre: 106 %
FEV6-%Change-Post: -1 %
FEV6-%Pred-Post: 96 %
FEV6-%Pred-Pre: 97 %
FEV6-Post: 3.5 L
FEV6-Pre: 3.54 L
FEV6FVC-%Pred-Post: 103 %
FEV6FVC-%Pred-Pre: 103 %
FVC-%Change-Post: -1 %
FVC-%Pred-Post: 93 %
FVC-%Pred-Pre: 94 %
FVC-Post: 3.5 L
FVC-Pre: 3.56 L
Post FEV1/FVC ratio: 82 %
Post FEV6/FVC ratio: 100 %
Pre FEV1/FVC ratio: 82 %
Pre FEV6/FVC Ratio: 100 %
RV % pred: 92 %
RV: 2.18 L
TLC % pred: 96 %
TLC: 5.62 L

## 2021-07-10 NOTE — Progress Notes (Signed)
? ?Subjective:  ? ? Patient ID: Brandi Cummings, female    DOB: 1955-06-30, 65 y.o.   MRN: [id number] ? ?HPI ?66 year old woman, never smoker, with a history of left breast cancer, diabetes, mitral valve prolapse, hypothyroidism, hyperlipidemia, depression.  She is here for chronic recurrent cough.  She had COVID-19 in 12/2019.  She reports that she had cough, HA, fatigue. The cough persisted for months, finally resolved. She has had recurrence of prolonged cough with more recent URI's. Has had some associated dyspnea, heard some noise ? Wheeze. Tried albuterol w some relief of cough and dyspnea. She has some underlying reflux, did try to treat this with the recent findings > pepcid qd for a while. She is on zyrtec. The cough is better, still happens, sometimes productive. She has some ear fullness.  ? ?Treated with doxycycline for possible associated sinusitis/bronchitis 05/09/2021 ? ? ?ROV 07/10/21 --follow-up visit for 66 year old never smoker for chronic cough.  She has a history of left breast cancer, diabetes, MVP, hypothyroidism, COVID-19 in 12/2019.  Seem to start after an upper respiratory infection and never cleared.  At her initial visit we started Pepcid, Zyrtec. She is still coughing, maybe less. Her ear fullness is better.  ? ?Pulmonary function testing performed today and reviewed by me shows normal airflows without a bronchodilator response, normal flow volume loop, normal lung volumes, normal diffusion capacity. ? ?Chest x-ray 07/03/2021 reviewed by me shows no evidence of cardiopulmonary disease. ? ? ?Review of Systems ?As per HPI ? ?Past Medical History:  ?Diagnosis Date  ? Cancer [facility])   ? Depression   ? Diabetes mellitus   ? Hyperlipidemia   ? Mononucleosis   ? as a teen  ? MVP (mitral valve prolapse) 1990  ? was told  yrs ago- eccho 2010  in [place]  ? Thyroid disease   ?  ? ?Family History  ?Problem Relation Age of Onset  ? Diabetes Mother   ? Stroke Mother   ? Hypertension Mother   ? Cancer Father    ? Heart attack Father   ?  ? ?Social History  ? ?Socioeconomic History  ? Marital status: Widowed  ?  Spouse name: Not on file  ? Number of children: Not on file  ? Years of education: Not on file  ? Highest education level: Not on file  ?Occupational History  ? Not on file  ?Tobacco Use  ? Smoking status: Never  ? Smokeless tobacco: Never  ?Substance and Sexual Activity  ? Alcohol use: No  ? Drug use: No  ? Sexual activity: Not on file  ?Other Topics Concern  ? Not on file  ?Social History Narrative  ? Not on file  ? ?Social Determinants of Health  ? ?Financial Resource Strain: Not on file  ?Food Insecurity: Not on file  ?Transportation Needs: Not on file  ?Physical Activity: Not on file  ?Stress: Not on file  ?Social Connections: Not on file  ?Intimate Partner Violence: Not on file  ?  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 ?Allergies  ?Allergen Reactions  ? Penicillins   ? Chlorhexidine   ? Lactose Intolerance (Gi)   ?  Does not use dairy without using lactaide  ? Shrimp [Shellfish Allergy] Hives  ?  ? ?Outpatient Medications Prior to Visit  ?Medication Sig Dispense Refill  ? albuterol (VENTOLIN HFA) 108 (90 Base) MCG/ACT inhaler Inhale 2 puffs into the lungs every 6 (six) hours as needed.    ? cetirizine (ZYRTEC) 10 MG  tablet Take 1 tablet (10 mg total) by mouth at bedtime. 30 tablet 2  ? famotidine (PEPCID) 20 MG tablet Take 1 tablet (20 mg total) by mouth 2 (two) times daily. 30 tablet 0  ? rosuvastatin (CRESTOR) 10 MG tablet Take 1 tablet by mouth daily.    ? thyroid (ARMOUR) 60 MG tablet Take 60 mg by mouth daily before breakfast.    ? metFORMIN (GLUCOPHAGE-XR) 500 MG 24 hr tablet Take 500 mg by mouth at bedtime.     ? venlafaxine XR (EFFEXOR-XR) 37.5 MG 24 hr capsule Take 1 capsule (37.5 mg total) by mouth daily with breakfast. 90 capsule 1  ? ?No facility-administered medications prior to visit.  ? ? ? ?   ?Objective:  ? Physical Exam ?Vitals:  ? 07/10/21 1603  ?BP: 136/74  ?Pulse: 76  ?Temp: 98.6 ?F (37 ?C)   ?TempSrc: Oral  ?SpO2: 96%  ?Weight: 198 lb (89.8 kg)  ?Height: '5\' 9"'$  (1.753 m)  ? ?Gen: Pleasant, well-nourished, in no distress,  normal affect ? ?ENT: No lesions,  mouth clear,  oropharynx clear, no postnasal drip ? ?Neck: No JVD, no stridor ? ?Lungs: No use of accessory muscles, no crackles or wheezing on normal respiration, no wheeze on forced expiration ? ?Cardiovascular: RRR, heart sounds normal, no murmur or gallops, no peripheral edema ? ?Musculoskeletal: No deformities, no cyanosis or clubbing ? ?Neuro: alert, awake, non focal ? ?Skin: Warm, no lesions or rash ? ?   ?Assessment & Plan:  ?Chronic cough ?We reviewed your pulmonary function testing and your chest x-ray today.  Both look good.  There is no evidence for asthma. ?Please continue your Pepcid and Zyrtec as you are taking them. ?Try your best to avoid throat clearing. ?You could try using Delsym as directed for cough suppression. ?Follow Dr. Lamonte Sakai in 4 weeks to review your status and plan next steps. ? ? ?Baltazar Apo, MD, PhD ?07/10/2021, 4:42 PM ?[facility] ?[phone] or if no answer before 7:00PM call [phone] ?For any issues after 7:00PM please call eLink [phone] ? ? ?

## 2021-07-10 NOTE — Patient Instructions (Signed)
We reviewed your pulmonary function testing and your chest x-ray today.  Both look good.  There is no evidence for asthma. ?Please continue your Pepcid and Zyrtec as you are taking them. ?Try your best to avoid throat clearing. ?You could try using Delsym as directed for cough suppression. ?Follow Dr. Lamonte Sakai in 4 weeks to review your status and plan next steps. ?

## 2021-07-10 NOTE — Progress Notes (Signed)
Full PFT performed today. °

## 2021-07-10 NOTE — Assessment & Plan Note (Signed)
We reviewed your pulmonary function testing and your chest x-ray today.  Both look good.  There is no evidence for asthma. ?Please continue your Pepcid and Zyrtec as you are taking them. ?Try your best to avoid throat clearing. ?You could try using Delsym as directed for cough suppression. ?Follow Dr. Lamonte Sakai in 4 weeks to review your status and plan next steps. ?

## 2021-07-10 NOTE — Patient Instructions (Signed)
Full PFT performed today. °

## 2021-08-10 ENCOUNTER — Encounter (HOSPITAL_BASED_OUTPATIENT_CLINIC_OR_DEPARTMENT_OTHER): Payer: Self-pay

## 2021-08-28 ENCOUNTER — Ambulatory Visit: Payer: BLUE CROSS/BLUE SHIELD | Admitting: Emergency Medicine

## 2021-09-09 ENCOUNTER — Ambulatory Visit (HOSPITAL_BASED_OUTPATIENT_CLINIC_OR_DEPARTMENT_OTHER): Payer: BLUE CROSS/BLUE SHIELD | Admitting: Nurse Practitioner

## 2021-10-21 ENCOUNTER — Ambulatory Visit: Payer: BLUE CROSS/BLUE SHIELD | Admitting: Emergency Medicine
